# Patient Record
Sex: Male | Born: 1935
Health system: Southern US, Community
[De-identification: ages and names within clinical notes are randomized; demographics above are authoritative.]

## PROBLEM LIST (undated history)

## (undated) DIAGNOSIS — Z974 Presence of external hearing-aid: Secondary | ICD-10-CM

## (undated) DIAGNOSIS — Z87442 Personal history of urinary calculi: Secondary | ICD-10-CM

## (undated) DIAGNOSIS — I10 Essential (primary) hypertension: Secondary | ICD-10-CM

## (undated) DIAGNOSIS — Z8719 Personal history of other diseases of the digestive system: Secondary | ICD-10-CM

## (undated) DIAGNOSIS — D494 Neoplasm of unspecified behavior of bladder: Secondary | ICD-10-CM

## (undated) DIAGNOSIS — C801 Malignant (primary) neoplasm, unspecified: Secondary | ICD-10-CM

## (undated) DIAGNOSIS — Z87898 Personal history of other specified conditions: Secondary | ICD-10-CM

## (undated) DIAGNOSIS — E785 Hyperlipidemia, unspecified: Secondary | ICD-10-CM

## (undated) HISTORY — PX: COLONOSCOPY: SHX174

## (undated) HISTORY — PX: HIATAL HERNIA REPAIR: SHX195

---

## 2004-03-10 ENCOUNTER — Ambulatory Visit (HOSPITAL_COMMUNITY): Admission: RE | Admit: 2004-03-10 | Discharge: 2004-03-10 | Payer: Self-pay | Admitting: *Deleted

## 2005-06-22 ENCOUNTER — Ambulatory Visit (HOSPITAL_COMMUNITY): Admission: RE | Admit: 2005-06-22 | Discharge: 2005-06-22 | Payer: Self-pay | Admitting: *Deleted

## 2006-05-07 ENCOUNTER — Ambulatory Visit (HOSPITAL_BASED_OUTPATIENT_CLINIC_OR_DEPARTMENT_OTHER): Admission: RE | Admit: 2006-05-07 | Discharge: 2006-05-07 | Payer: Self-pay | Admitting: Orthopedic Surgery

## 2013-05-01 DIAGNOSIS — Z87898 Personal history of other specified conditions: Secondary | ICD-10-CM

## 2013-05-01 HISTORY — DX: Personal history of other specified conditions: Z87.898

## 2013-11-09 ENCOUNTER — Encounter (HOSPITAL_COMMUNITY): Payer: Self-pay | Admitting: Emergency Medicine

## 2013-11-09 ENCOUNTER — Emergency Department (HOSPITAL_COMMUNITY)
Admission: EM | Admit: 2013-11-09 | Discharge: 2013-11-09 | Disposition: A | Discharge status: Home / Self Care | Payer: Medicare Other | Attending: Emergency Medicine | Admitting: Emergency Medicine

## 2013-11-09 DIAGNOSIS — Z79899 Other long term (current) drug therapy: Secondary | ICD-10-CM | POA: Insufficient documentation

## 2013-11-09 DIAGNOSIS — I10 Essential (primary) hypertension: Secondary | ICD-10-CM | POA: Insufficient documentation

## 2013-11-09 DIAGNOSIS — R339 Retention of urine, unspecified: Secondary | ICD-10-CM | POA: Insufficient documentation

## 2013-11-09 DIAGNOSIS — E785 Hyperlipidemia, unspecified: Secondary | ICD-10-CM | POA: Insufficient documentation

## 2013-11-09 LAB — CBC WITH DIFFERENTIAL/PLATELET
Basophils Absolute: 0 10*3/uL (ref 0.0–0.1)
Basophils Relative: 0 % (ref 0–1)
EOS PCT: 0 % (ref 0–5)
Eosinophils Absolute: 0 10*3/uL (ref 0.0–0.7)
HEMATOCRIT: 43.9 % (ref 39.0–52.0)
Hemoglobin: 15.2 g/dL (ref 13.0–17.0)
LYMPHS ABS: 1.9 10*3/uL (ref 0.7–4.0)
LYMPHS PCT: 15 % (ref 12–46)
MCH: 30 pg (ref 26.0–34.0)
MCHC: 34.6 g/dL (ref 30.0–36.0)
MCV: 86.6 fL (ref 78.0–100.0)
Monocytes Absolute: 0.7 10*3/uL (ref 0.1–1.0)
Monocytes Relative: 6 % (ref 3–12)
NEUTROS ABS: 10.4 10*3/uL — AB (ref 1.7–7.7)
Neutrophils Relative %: 79 % — ABNORMAL HIGH (ref 43–77)
PLATELETS: 247 10*3/uL (ref 150–400)
RBC: 5.07 MIL/uL (ref 4.22–5.81)
RDW: 12.8 % (ref 11.5–15.5)
WBC: 13.1 10*3/uL — AB (ref 4.0–10.5)

## 2013-11-09 LAB — COMPREHENSIVE METABOLIC PANEL
ALK PHOS: 70 U/L (ref 39–117)
ALT: 16 U/L (ref 0–53)
AST: 18 U/L (ref 0–37)
Albumin: 4 g/dL (ref 3.5–5.2)
Anion gap: 16 — ABNORMAL HIGH (ref 5–15)
BUN: 20 mg/dL (ref 6–23)
CO2: 23 meq/L (ref 19–32)
Calcium: 9.2 mg/dL (ref 8.4–10.5)
Chloride: 101 mEq/L (ref 96–112)
Creatinine, Ser: 0.99 mg/dL (ref 0.50–1.35)
GFR, EST NON AFRICAN AMERICAN: 78 mL/min — AB (ref >90)
GLUCOSE: 116 mg/dL — AB (ref 70–99)
Potassium: 4.5 mEq/L (ref 3.7–5.3)
Sodium: 140 mEq/L (ref 137–147)
Total Bilirubin: 0.7 mg/dL (ref 0.3–1.2)
Total Protein: 7.1 g/dL (ref 6.0–8.3)

## 2013-11-09 LAB — URINALYSIS, ROUTINE W REFLEX MICROSCOPIC
Bilirubin Urine: NEGATIVE
Glucose, UA: NEGATIVE mg/dL
HGB URINE DIPSTICK: NEGATIVE
KETONES UR: 40 mg/dL — AB
Leukocytes, UA: NEGATIVE
Nitrite: NEGATIVE
PROTEIN: 100 mg/dL — AB
Specific Gravity, Urine: 1.028 (ref 1.005–1.030)
UROBILINOGEN UA: 1 mg/dL (ref 0.0–1.0)
pH: 5 (ref 5.0–8.0)

## 2013-11-09 LAB — URINE MICROSCOPIC-ADD ON

## 2013-11-09 NOTE — ED Provider Notes (Signed)
CSN: [id number]     Arrival date & time 11/09/13  1717 History   First MD Initiated Contact with Patient 11/09/13 1801     Chief Complaint  Patient presents with  . Urinary Retention     (Consider location/radiation/quality/duration/timing/severity/associated sxs/prior Treatment) HPI 78 year old male with a history of hypertension and hyperlipidemia who presents today with decreased urine output.  Per the patient he was able to pass urine this morning at approximately 900. Since that time he has had the urge to urinate but is been unable to do so. Patient also reports she has had mild loose stools over the course of the day and feels that he may be constipated. Patient states that he had a regular bowel movement possibly yesterday or the day before. Patient denies any fevers, nausea, vomiting. Patient denies any chest pain. Patient states he does have some mild suprapubic abdominal pain is cramping in nature.  History reviewed. No pertinent past medical history. History reviewed. No pertinent past surgical history. No family history on file. History  Substance Use Topics  . Smoking status: Never Smoker   . Smokeless tobacco: Not on file  . Alcohol Use: No    Review of Systems  Constitutional: Negative for fever and chills.  HENT: Negative for sore throat.   Eyes: Negative for pain.  Respiratory: Negative for cough and shortness of breath.   Cardiovascular: Negative for chest pain.  Gastrointestinal: Positive for abdominal pain. Negative for nausea and vomiting.  Genitourinary: Negative for dysuria and flank pain.  Musculoskeletal: Negative for back pain and neck pain.  Skin: Negative for rash.  Neurological: Negative for seizures and headaches.      Allergies  Review of patient's allergies indicates no known allergies.  Home Medications   Prior to Admission medications   Medication Sig Start Date End Date Taking? Authorizing Provider  lisinopril (PRINIVIL,ZESTRIL) 20 MG  tablet Take 20 mg by mouth daily. 08/31/13  Yes Historical Provider, MD  lovastatin (MEVACOR) 40 MG tablet Take 40 mg by mouth 2 (two) times daily. 10/20/13  Yes Historical Provider, MD   BP 134/86  Pulse 76  Temp(Src) 97.8 F (36.6 C) (Oral)  Resp 29  Ht 5\' 6"  (1.676 m)  Wt 185 lb (83.915 kg)  BMI 29.87 kg/m2  SpO2 95% Physical Exam  Constitutional: He is oriented to person, place, and time. He appears well-developed and well-nourished. No distress.  HENT:  Head: Normocephalic and atraumatic.  Eyes: Pupils are equal, round, and reactive to light.  Neck: Normal range of motion.  Cardiovascular: Normal rate and regular rhythm.   Pulmonary/Chest: Effort normal and breath sounds normal.  Abdominal: Soft. He exhibits no distension. There is tenderness in the suprapubic area. There is no rigidity, no guarding and no tenderness at McBurney's point.  Musculoskeletal: Normal range of motion.  Neurological: He is alert and oriented to person, place, and time.  Skin: Skin is warm. He is not diaphoretic.    ED Course  Procedures (including critical care time) Labs Review Labs Reviewed  CBC WITH DIFFERENTIAL - Abnormal; Notable for the following:    WBC 13.1 (*)    Neutrophils Relative % 79 (*)    Neutro Abs 10.4 (*)    All other components within normal limits  COMPREHENSIVE METABOLIC PANEL - Abnormal; Notable for the following:    Glucose, Bld 116 (*)    GFR calc non Af Amer 78 (*)    Anion gap 16 (*)    All other components within  normal limits  URINALYSIS, ROUTINE W REFLEX MICROSCOPIC - Abnormal; Notable for the following:    Color, Urine AMBER (*)    Ketones, ur 40 (*)    Protein, ur 100 (*)    All other components within normal limits  URINE MICROSCOPIC-ADD ON - Abnormal; Notable for the following:    Casts GRANULAR CAST (*)    All other components within normal limits    Imaging Review No results found.   EKG Interpretation None      MDM   Final diagnoses:  Urine  retention   78 year old male with a history of hypertension and hyperlipidemia presents today for acute urinary retention.  History as documented above. On arrival in the patient appears in no acute distress. He is hemodynamically stable. Patient had a bedside bladder scan done that demonstrates a likely triggered 50 cc of urine in his bladder. Given the patient is been unable to void here, the plan is to place a Foley catheter. Foley catheter was placed and had greater than 300 cc of urine output. Disposition is pending the results of his urinalysis. We'll consider leaving catheter in place and followup with urologist.  Urinalysis done which is no signs of overt infection. We'll leave the Foley in place and will have the patient follow up with urology and or his primary care physician. Strict return progression for given. Patient discharged in stable condition. Patient was seen and evaluated by myself and by the attending Dr. Darl Householder.     Freddi Che, MD 11/10/13 [phone]

## 2013-11-09 NOTE — ED Notes (Signed)
Brought pt back to room via wheelchair; pt undressed totally, placed in gown, on monitor, continuous pulse oximetry and blood pressure cuff; 3 green chuks placed underneath pt for he has been having loose bowel movements; pt aware of need of urine specimen and stated " I feel like I need to go but I just can't" urinal placed at bedside for pt's convenience; Ronny Bacon, NT present

## 2013-11-09 NOTE — ED Notes (Signed)
Pt reports no urinary output since 0900 today. Pt denies history of same. Pt reports loose stools and feels he may be constipated. Pt unsure of when he had last normal BM. Pt with distended abdomen reports unchanged.

## 2013-11-09 NOTE — Discharge Instructions (Signed)
Acute Urinary Retention °Acute urinary retention is the temporary inability to urinate. °This is a common problem in older men. As men age their prostates become larger and block the flow of urine from the bladder. This is usually a problem that has come on gradually.  °HOME CARE INSTRUCTIONS °If you are sent home with a Foley catheter and a drainage system, you will need to discuss the best course of action with your health care provider. While the catheter is in, maintain a good intake of fluids. Keep the drainage bag emptied and lower than your catheter. This is so that contaminated urine will not flow back into your bladder, which could lead to a urinary tract infection. °There are two main types of drainage bags. One is a large bag that usually is used at night. It has a good capacity that will allow you to sleep through the night without having to empty it. The second type is called a leg bag. It has a smaller capacity, so it needs to be emptied more frequently. However, the main advantage is that it can be attached by a leg strap and can go underneath your clothing, allowing you the freedom to move about or leave your home. °Only take over-the-counter or prescription medicines for pain, discomfort, or fever as directed by your health care provider.  °SEEK MEDICAL CARE IF: °· You develop a low-grade fever. °· You experience spasms or leakage of urine with the spasms. °SEEK IMMEDIATE MEDICAL CARE IF:  °· You develop chills or fever. °· Your catheter stops draining urine. °· Your catheter falls out. °· You start to develop increased bleeding that does not respond to rest and increased fluid intake. °MAKE SURE YOU: °· Understand these instructions. °· Will watch your condition. °· Will get help right away if you are not doing well or get worse. °Document Released: 07/24/2000 Document Revised: 04/22/2013 Document Reviewed: 09/26/2012 °Exit[employer]® Patient Information ©2015 [employer]. This information is not  intended to replace advice given to you by your health care provider. Make sure you discuss any questions you have with your health care provider. ° °

## 2013-11-11 NOTE — ED Provider Notes (Signed)
I saw and evaluated the patient, reviewed the resident's note and I agree with the findings and plan.   EKG Interpretation None      Zachary Ingram is a 78 y.o. male here with urinary retention. Unable to urinate since 9 am this morning. Has lower abdominal pain. Patient unable to urinate in the ED. Foley has more than 300 cc urine output. Abdomen soft after foley placed. Labs and UA unremarkable. Will keep foley in and have him f/u with urology.    Wandra Arthurs, MD 11/11/13 2227

## 2014-09-23 DIAGNOSIS — R339 Retention of urine, unspecified: Secondary | ICD-10-CM | POA: Diagnosis not present

## 2014-09-23 DIAGNOSIS — N401 Enlarged prostate with lower urinary tract symptoms: Secondary | ICD-10-CM | POA: Diagnosis not present

## 2014-09-30 DIAGNOSIS — K4090 Unilateral inguinal hernia, without obstruction or gangrene, not specified as recurrent: Secondary | ICD-10-CM | POA: Diagnosis not present

## 2014-09-30 DIAGNOSIS — N401 Enlarged prostate with lower urinary tract symptoms: Secondary | ICD-10-CM | POA: Diagnosis not present

## 2014-09-30 DIAGNOSIS — R351 Nocturia: Secondary | ICD-10-CM | POA: Diagnosis not present

## 2014-09-30 DIAGNOSIS — N138 Other obstructive and reflux uropathy: Secondary | ICD-10-CM | POA: Diagnosis not present

## 2014-09-30 DIAGNOSIS — R300 Dysuria: Secondary | ICD-10-CM | POA: Diagnosis not present

## 2014-10-20 DIAGNOSIS — Z1389 Encounter for screening for other disorder: Secondary | ICD-10-CM | POA: Diagnosis not present

## 2014-10-20 DIAGNOSIS — Z0000 Encounter for general adult medical examination without abnormal findings: Secondary | ICD-10-CM | POA: Diagnosis not present

## 2014-10-20 DIAGNOSIS — E782 Mixed hyperlipidemia: Secondary | ICD-10-CM | POA: Diagnosis not present

## 2014-10-20 DIAGNOSIS — R7301 Impaired fasting glucose: Secondary | ICD-10-CM | POA: Diagnosis not present

## 2014-10-27 DIAGNOSIS — I10 Essential (primary) hypertension: Secondary | ICD-10-CM | POA: Diagnosis not present

## 2014-10-27 DIAGNOSIS — Z0000 Encounter for general adult medical examination without abnormal findings: Secondary | ICD-10-CM | POA: Diagnosis not present

## 2014-10-27 DIAGNOSIS — E782 Mixed hyperlipidemia: Secondary | ICD-10-CM | POA: Diagnosis not present

## 2014-10-27 DIAGNOSIS — R7301 Impaired fasting glucose: Secondary | ICD-10-CM | POA: Diagnosis not present

## 2014-10-27 DIAGNOSIS — Z23 Encounter for immunization: Secondary | ICD-10-CM | POA: Diagnosis not present

## 2015-05-11 DIAGNOSIS — R7301 Impaired fasting glucose: Secondary | ICD-10-CM | POA: Diagnosis not present

## 2015-05-11 DIAGNOSIS — E782 Mixed hyperlipidemia: Secondary | ICD-10-CM | POA: Diagnosis not present

## 2015-05-11 DIAGNOSIS — I10 Essential (primary) hypertension: Secondary | ICD-10-CM | POA: Diagnosis not present

## 2015-05-18 DIAGNOSIS — E782 Mixed hyperlipidemia: Secondary | ICD-10-CM | POA: Diagnosis not present

## 2015-05-18 DIAGNOSIS — I10 Essential (primary) hypertension: Secondary | ICD-10-CM | POA: Diagnosis not present

## 2015-05-18 DIAGNOSIS — R7301 Impaired fasting glucose: Secondary | ICD-10-CM | POA: Diagnosis not present

## 2015-11-23 DIAGNOSIS — I10 Essential (primary) hypertension: Secondary | ICD-10-CM | POA: Diagnosis not present

## 2015-11-23 DIAGNOSIS — R7301 Impaired fasting glucose: Secondary | ICD-10-CM | POA: Diagnosis not present

## 2015-11-23 DIAGNOSIS — E782 Mixed hyperlipidemia: Secondary | ICD-10-CM | POA: Diagnosis not present

## 2015-12-01 DIAGNOSIS — I10 Essential (primary) hypertension: Secondary | ICD-10-CM | POA: Diagnosis not present

## 2015-12-01 DIAGNOSIS — M150 Primary generalized (osteo)arthritis: Secondary | ICD-10-CM | POA: Diagnosis not present

## 2015-12-01 DIAGNOSIS — E782 Mixed hyperlipidemia: Secondary | ICD-10-CM | POA: Diagnosis not present

## 2015-12-01 DIAGNOSIS — R7301 Impaired fasting glucose: Secondary | ICD-10-CM | POA: Diagnosis not present

## 2015-12-08 DIAGNOSIS — I10 Essential (primary) hypertension: Secondary | ICD-10-CM | POA: Diagnosis not present

## 2015-12-08 DIAGNOSIS — E782 Mixed hyperlipidemia: Secondary | ICD-10-CM | POA: Diagnosis not present

## 2015-12-08 DIAGNOSIS — Z1211 Encounter for screening for malignant neoplasm of colon: Secondary | ICD-10-CM | POA: Diagnosis not present

## 2016-01-06 DIAGNOSIS — K5730 Diverticulosis of large intestine without perforation or abscess without bleeding: Secondary | ICD-10-CM | POA: Diagnosis not present

## 2016-01-06 DIAGNOSIS — Z1211 Encounter for screening for malignant neoplasm of colon: Secondary | ICD-10-CM | POA: Diagnosis not present

## 2016-06-06 DIAGNOSIS — H2512 Age-related nuclear cataract, left eye: Secondary | ICD-10-CM | POA: Diagnosis not present

## 2016-06-06 DIAGNOSIS — H35372 Puckering of macula, left eye: Secondary | ICD-10-CM | POA: Diagnosis not present

## 2016-06-06 DIAGNOSIS — H2511 Age-related nuclear cataract, right eye: Secondary | ICD-10-CM | POA: Diagnosis not present

## 2016-06-06 DIAGNOSIS — H04123 Dry eye syndrome of bilateral lacrimal glands: Secondary | ICD-10-CM | POA: Diagnosis not present

## 2016-06-14 DIAGNOSIS — R7301 Impaired fasting glucose: Secondary | ICD-10-CM | POA: Diagnosis not present

## 2016-06-14 DIAGNOSIS — E782 Mixed hyperlipidemia: Secondary | ICD-10-CM | POA: Diagnosis not present

## 2016-06-21 DIAGNOSIS — I10 Essential (primary) hypertension: Secondary | ICD-10-CM | POA: Diagnosis not present

## 2016-06-21 DIAGNOSIS — M150 Primary generalized (osteo)arthritis: Secondary | ICD-10-CM | POA: Diagnosis not present

## 2016-06-21 DIAGNOSIS — R7301 Impaired fasting glucose: Secondary | ICD-10-CM | POA: Diagnosis not present

## 2016-06-21 DIAGNOSIS — E782 Mixed hyperlipidemia: Secondary | ICD-10-CM | POA: Diagnosis not present

## 2016-07-04 DIAGNOSIS — H6123 Impacted cerumen, bilateral: Secondary | ICD-10-CM | POA: Diagnosis not present

## 2016-07-13 DIAGNOSIS — H2512 Age-related nuclear cataract, left eye: Secondary | ICD-10-CM | POA: Diagnosis not present

## 2016-08-03 DIAGNOSIS — H2511 Age-related nuclear cataract, right eye: Secondary | ICD-10-CM | POA: Diagnosis not present

## 2016-09-01 DIAGNOSIS — H35372 Puckering of macula, left eye: Secondary | ICD-10-CM | POA: Diagnosis not present

## 2016-11-29 HISTORY — PX: CATARACT EXTRACTION W/ INTRAOCULAR LENS  IMPLANT, BILATERAL: SHX1307

## 2016-12-04 DIAGNOSIS — H35372 Puckering of macula, left eye: Secondary | ICD-10-CM | POA: Diagnosis not present

## 2016-12-27 DIAGNOSIS — H60502 Unspecified acute noninfective otitis externa, left ear: Secondary | ICD-10-CM | POA: Diagnosis not present

## 2016-12-27 DIAGNOSIS — Z23 Encounter for immunization: Secondary | ICD-10-CM | POA: Diagnosis not present

## 2016-12-27 DIAGNOSIS — H6123 Impacted cerumen, bilateral: Secondary | ICD-10-CM | POA: Diagnosis not present

## 2017-01-10 DIAGNOSIS — Z0000 Encounter for general adult medical examination without abnormal findings: Secondary | ICD-10-CM | POA: Diagnosis not present

## 2017-01-10 DIAGNOSIS — R7301 Impaired fasting glucose: Secondary | ICD-10-CM | POA: Diagnosis not present

## 2017-01-10 DIAGNOSIS — E782 Mixed hyperlipidemia: Secondary | ICD-10-CM | POA: Diagnosis not present

## 2017-01-17 DIAGNOSIS — I10 Essential (primary) hypertension: Secondary | ICD-10-CM | POA: Diagnosis not present

## 2017-01-17 DIAGNOSIS — R319 Hematuria, unspecified: Secondary | ICD-10-CM | POA: Diagnosis not present

## 2017-01-17 DIAGNOSIS — E782 Mixed hyperlipidemia: Secondary | ICD-10-CM | POA: Diagnosis not present

## 2017-01-17 DIAGNOSIS — R7301 Impaired fasting glucose: Secondary | ICD-10-CM | POA: Diagnosis not present

## 2017-01-23 DIAGNOSIS — H10022 Other mucopurulent conjunctivitis, left eye: Secondary | ICD-10-CM | POA: Diagnosis not present

## 2017-07-02 DIAGNOSIS — H6123 Impacted cerumen, bilateral: Secondary | ICD-10-CM | POA: Diagnosis not present

## 2017-07-18 DIAGNOSIS — E782 Mixed hyperlipidemia: Secondary | ICD-10-CM | POA: Diagnosis not present

## 2017-07-18 DIAGNOSIS — R7301 Impaired fasting glucose: Secondary | ICD-10-CM | POA: Diagnosis not present

## 2017-07-25 DIAGNOSIS — R319 Hematuria, unspecified: Secondary | ICD-10-CM | POA: Diagnosis not present

## 2017-07-25 DIAGNOSIS — I10 Essential (primary) hypertension: Secondary | ICD-10-CM | POA: Diagnosis not present

## 2017-07-25 DIAGNOSIS — R7301 Impaired fasting glucose: Secondary | ICD-10-CM | POA: Diagnosis not present

## 2017-07-25 DIAGNOSIS — E782 Mixed hyperlipidemia: Secondary | ICD-10-CM | POA: Diagnosis not present

## 2017-07-30 DIAGNOSIS — R3121 Asymptomatic microscopic hematuria: Secondary | ICD-10-CM | POA: Diagnosis not present

## 2017-08-07 DIAGNOSIS — R3121 Asymptomatic microscopic hematuria: Secondary | ICD-10-CM | POA: Diagnosis not present

## 2017-08-07 DIAGNOSIS — R3129 Other microscopic hematuria: Secondary | ICD-10-CM | POA: Diagnosis not present

## 2017-08-09 DIAGNOSIS — R911 Solitary pulmonary nodule: Secondary | ICD-10-CM | POA: Diagnosis not present

## 2017-08-09 DIAGNOSIS — N200 Calculus of kidney: Secondary | ICD-10-CM | POA: Diagnosis not present

## 2017-08-09 DIAGNOSIS — C670 Malignant neoplasm of trigone of bladder: Secondary | ICD-10-CM | POA: Diagnosis not present

## 2017-08-14 ENCOUNTER — Encounter (HOSPITAL_BASED_OUTPATIENT_CLINIC_OR_DEPARTMENT_OTHER): Payer: Self-pay | Admitting: *Deleted

## 2017-08-14 ENCOUNTER — Other Ambulatory Visit: Payer: Self-pay | Admitting: Urology

## 2017-08-21 ENCOUNTER — Encounter (HOSPITAL_BASED_OUTPATIENT_CLINIC_OR_DEPARTMENT_OTHER): Payer: Self-pay | Admitting: *Deleted

## 2017-08-21 ENCOUNTER — Other Ambulatory Visit: Payer: Self-pay

## 2017-08-21 NOTE — Progress Notes (Signed)
SPOKE W/ PT VIA PHONE FOR PRE-OP INTERVIEW.  NPO AFTER MN.  ARRIVE AT [phone].  NEEDS ISTAT AND EKG.

## 2017-08-22 NOTE — H&P (Signed)
CC: I have bladder cancer.  HPI: Zachary Ingram is a 82 year-old male established patient who is here for bladder cancer.    Zachary Ingram came in today after I left a message to call me back about the CT scan done on 08/07/17. He was found to have an enhancing 2mm lesion near the right UO consistent with a bladder tumor. He was also found to have non-obstructing renal stones, a right inguinal hernia with some bowel and bladder involvement and small pulmonary nodules. He is currently doing well without complaints.      ALLERGIES: No Allergies    MEDICATIONS: Aspirin Ec 81 mg tablet, delayed release Oral  Fish Oil CAPS Oral  Lisinopril 10 mg tablet Oral  Lovastatin 40 mg tablet     GU PSH: Locm 300-399Mg /Ml Iodine,1Ml - 08/07/2017 Vasectomy - 1982      PSH Notes: Jaw Surgery   NON-GU PSH: None   GU PMH: Microscopic hematuria, He has significant microhematuria and needs eval with CT and possible cystoscopy. - 07/30/2017 BPH w/LUTS, Benign prostatic hyperplasia with urinary obstruction - August 10, 2014 Dysuria, Dysuria - 08/10/2014 Nocturia, Nocturia - 08/10/2014 Unil Inguinal Hernia W/O obst or gang,non-recurrent, Inguinal hernia, right - 08-10-14 Urinary Retention, Unspec, Urinary retention - Aug 09, 2013 Gross hematuria, Gross hematuria - August 09, 2013    NON-GU PMH: Encounter for general adult medical examination without abnormal findings, Encounter for preventive health examination - 2014/08/10 Personal history of other diseases of the circulatory system, History of hypertension - 08-09-13 Personal history of other endocrine, nutritional and metabolic disease, History of hypercholesterolemia - 2013/08/09    FAMILY HISTORY: Death of family member - Runs In Family   SOCIAL HISTORY: Marital Status: Married Preferred Language: English; Race: White Current Smoking Status: Patient has never smoked.   Tobacco Use Assessment Completed: Used Tobacco in last 30 days? Drinks 3 caffeinated drinks per day.     Notes: Retired, Divorced, No alcohol  use, Never a smoker, Daily caffeine consumption, 2-3 servings a day, One child   REVIEW OF SYSTEMS:    GU Review Male:   Patient denies frequent urination, hard to postpone urination, burning/ pain with urination, get up at night to urinate, leakage of urine, stream starts and stops, trouble starting your stream, have to strain to urinate , erection problems, and penile pain.  Gastrointestinal (Upper):   Patient denies nausea, vomiting, and indigestion/ heartburn.  Gastrointestinal (Lower):   Patient denies diarrhea and constipation.  Constitutional:   Patient denies fever, night sweats, weight loss, and fatigue.  Skin:   Patient denies skin rash/ lesion and itching.  Eyes:   Patient denies blurred vision and double vision.  Ears/ Nose/ Throat:   Patient denies sore throat and sinus problems.  Hematologic/Lymphatic:   Patient denies swollen glands and easy bruising.  Cardiovascular:   Patient denies leg swelling and chest pains.  Respiratory:   Patient denies cough and shortness of breath.  Endocrine:   Patient denies excessive thirst.  Musculoskeletal:   Patient denies joint pain and back pain.  Neurological:   Patient denies headaches and dizziness.  Psychologic:   Patient denies depression and anxiety.   VITAL SIGNS: None   MULTI-SYSTEM PHYSICAL EXAMINATION:    Constitutional: Well-nourished. No physical deformities. Normally developed. Good grooming.  Respiratory: No labored breathing, no use of accessory muscles. Normal breath sounds.   Cardiovascular: Regular rate and rhythm. No murmur, no gallop. no swelling, no varicosities.      PAST DATA REVIEWED:  Source Of History:  Patient  X-Ray Review: C.T. Hematuria: Reviewed Films. Reviewed Report. Discussed With Patient.     09/24/14  PSA  Total PSA 2.16     PROCEDURES: None   ASSESSMENT:      ICD-10 Details  1 GU:   Bladder Cancer Trigone - C67.0 He has an 86mm right trigonal lesion that is worrisome for UCCa. I am going to  get him set up for cystoscopy with TURBT, possible right stent and possible epirubicin instillation. I reviewed the risks of bleeding, infection, bladder injury, need for a stent and secondary procedures, chemical cystitis, thrombotic events and anesthetic complications.   2   Renal calculus - N20.0   3 NON-GU:   Solitary pulmonary nodule - R91.1 He will need a Chest CT for further evaluation. I will discuss that with him at his post op visit.    PLAN:           Schedule Return Visit/Planned Activity: Next Available Appointment - Schedule Surgery          Document Letter(s):  Created for Patient: Clinical Summary         Notes:   CC: Dr. Merrilee Seashore.         Next Appointment:      Next Appointment: 08/29/2017 10:15 AM    Appointment Type: Male Cysto    Location: [facility] [phone]    Provider: [name], M.D.    Reason for Visit: next avail cysto

## 2017-08-23 ENCOUNTER — Ambulatory Visit (HOSPITAL_BASED_OUTPATIENT_CLINIC_OR_DEPARTMENT_OTHER)
Admission: RE | Admit: 2017-08-23 | Discharge: 2017-08-23 | Disposition: A | Discharge status: Home / Self Care | Payer: Medicare Other | Source: Ambulatory Visit | Attending: Urology | Admitting: Urology

## 2017-08-23 ENCOUNTER — Ambulatory Visit (HOSPITAL_BASED_OUTPATIENT_CLINIC_OR_DEPARTMENT_OTHER): Payer: Medicare Other | Admitting: Anesthesiology

## 2017-08-23 ENCOUNTER — Encounter (HOSPITAL_BASED_OUTPATIENT_CLINIC_OR_DEPARTMENT_OTHER)
Admission: RE | Disposition: A | Discharge status: Home / Self Care | Payer: Self-pay | Source: Ambulatory Visit | Attending: Urology

## 2017-08-23 ENCOUNTER — Encounter (HOSPITAL_BASED_OUTPATIENT_CLINIC_OR_DEPARTMENT_OTHER): Payer: Self-pay | Admitting: *Deleted

## 2017-08-23 DIAGNOSIS — N3289 Other specified disorders of bladder: Secondary | ICD-10-CM | POA: Diagnosis not present

## 2017-08-23 DIAGNOSIS — D494 Neoplasm of unspecified behavior of bladder: Secondary | ICD-10-CM | POA: Diagnosis not present

## 2017-08-23 DIAGNOSIS — Z79899 Other long term (current) drug therapy: Secondary | ICD-10-CM | POA: Diagnosis not present

## 2017-08-23 DIAGNOSIS — E7800 Pure hypercholesterolemia, unspecified: Secondary | ICD-10-CM | POA: Diagnosis not present

## 2017-08-23 DIAGNOSIS — Z7982 Long term (current) use of aspirin: Secondary | ICD-10-CM | POA: Diagnosis not present

## 2017-08-23 DIAGNOSIS — C670 Malignant neoplasm of trigone of bladder: Secondary | ICD-10-CM | POA: Insufficient documentation

## 2017-08-23 DIAGNOSIS — R911 Solitary pulmonary nodule: Secondary | ICD-10-CM | POA: Insufficient documentation

## 2017-08-23 DIAGNOSIS — C679 Malignant neoplasm of bladder, unspecified: Secondary | ICD-10-CM | POA: Diagnosis not present

## 2017-08-23 DIAGNOSIS — N400 Enlarged prostate without lower urinary tract symptoms: Secondary | ICD-10-CM | POA: Diagnosis not present

## 2017-08-23 DIAGNOSIS — I10 Essential (primary) hypertension: Secondary | ICD-10-CM | POA: Diagnosis not present

## 2017-08-23 DIAGNOSIS — E785 Hyperlipidemia, unspecified: Secondary | ICD-10-CM | POA: Diagnosis not present

## 2017-08-23 DIAGNOSIS — R310 Gross hematuria: Secondary | ICD-10-CM | POA: Diagnosis not present

## 2017-08-23 HISTORY — DX: Personal history of other specified conditions: Z87.898

## 2017-08-23 HISTORY — PX: TRANSURETHRAL RESECTION OF BLADDER TUMOR WITH MITOMYCIN-C: SHX6459

## 2017-08-23 HISTORY — DX: Neoplasm of unspecified behavior of bladder: D49.4

## 2017-08-23 HISTORY — DX: Hyperlipidemia, unspecified: E78.5

## 2017-08-23 HISTORY — PX: CYSTOSCOPY W/ URETERAL STENT PLACEMENT: SHX1429

## 2017-08-23 HISTORY — DX: Presence of external hearing-aid: Z97.4

## 2017-08-23 HISTORY — DX: Essential (primary) hypertension: I10

## 2017-08-23 HISTORY — DX: Personal history of other diseases of the digestive system: Z87.19

## 2017-08-23 LAB — POCT I-STAT 4, (NA,K, GLUC, HGB,HCT)
Glucose, Bld: 124 mg/dL — ABNORMAL HIGH (ref 65–99)
HEMATOCRIT: 41 % (ref 39.0–52.0)
HEMOGLOBIN: 13.9 g/dL (ref 13.0–17.0)
Potassium: 4.3 mmol/L (ref 3.5–5.1)
Sodium: 140 mmol/L (ref 135–145)

## 2017-08-23 SURGERY — TRANSURETHRAL RESECTION OF BLADDER TUMOR WITH MITOMYCIN-C
Anesthesia: General | Laterality: Right

## 2017-08-23 MED ORDER — DEXAMETHASONE SODIUM PHOSPHATE 10 MG/ML IJ SOLN
INTRAMUSCULAR | Status: AC
  Filled 2017-08-23: qty 1

## 2017-08-23 MED ORDER — FENTANYL CITRATE (PF) 100 MCG/2ML IJ SOLN
25.0000 ug | INTRAMUSCULAR | Status: DC | PRN
  Filled 2017-08-23: qty 1

## 2017-08-23 MED ORDER — LIDOCAINE 2% (20 MG/ML) 5 ML SYRINGE
INTRAMUSCULAR | Status: DC | PRN
  Administered 2017-08-23: 80 mg via INTRAVENOUS

## 2017-08-23 MED ORDER — LACTATED RINGERS IV SOLN
INTRAVENOUS | Status: DC
  Administered 2017-08-23: 10:00:00 via INTRAVENOUS
  Filled 2017-08-23: qty 1000

## 2017-08-23 MED ORDER — SODIUM CHLORIDE 0.9 % IR SOLN
Status: DC | PRN
  Administered 2017-08-23 (×2): 3000 mL via INTRAVESICAL

## 2017-08-23 MED ORDER — ONDANSETRON HCL 4 MG/2ML IJ SOLN
INTRAMUSCULAR | Status: AC
  Filled 2017-08-23: qty 2

## 2017-08-23 MED ORDER — IOHEXOL 300 MG/ML  SOLN
INTRAMUSCULAR | Status: DC | PRN
  Administered 2017-08-23: 10 mL via URETHRAL

## 2017-08-23 MED ORDER — PROPOFOL 10 MG/ML IV BOLUS
INTRAVENOUS | Status: AC
  Filled 2017-08-23: qty 40

## 2017-08-23 MED ORDER — STERILE WATER FOR IRRIGATION IR SOLN
Status: DC | PRN
  Administered 2017-08-23: 500 mL

## 2017-08-23 MED ORDER — PROPOFOL 10 MG/ML IV BOLUS
INTRAVENOUS | Status: DC | PRN
  Administered 2017-08-23: 150 mg via INTRAVENOUS

## 2017-08-23 MED ORDER — TRAMADOL HCL 50 MG PO TABS: 50.0000 mg | ORAL_TABLET | Freq: Four times a day (QID) | ORAL | 0 refills | Status: DC | PRN

## 2017-08-23 MED ORDER — ONDANSETRON HCL 4 MG/2ML IJ SOLN
INTRAMUSCULAR | Status: DC | PRN
  Administered 2017-08-23: 4 mg via INTRAVENOUS

## 2017-08-23 MED ORDER — FENTANYL CITRATE (PF) 100 MCG/2ML IJ SOLN
INTRAMUSCULAR | Status: AC
  Filled 2017-08-23: qty 4

## 2017-08-23 MED ORDER — CEFAZOLIN SODIUM-DEXTROSE 2-4 GM/100ML-% IV SOLN
2.0000 g | INTRAVENOUS | Status: AC
  Administered 2017-08-23: 2 g via INTRAVENOUS
  Filled 2017-08-23: qty 100

## 2017-08-23 MED ORDER — ACETAMINOPHEN 10 MG/ML IV SOLN
INTRAVENOUS | Status: AC
  Filled 2017-08-23: qty 100

## 2017-08-23 MED ORDER — CEFAZOLIN SODIUM-DEXTROSE 2-4 GM/100ML-% IV SOLN
INTRAVENOUS | Status: AC
  Filled 2017-08-23: qty 100

## 2017-08-23 MED ORDER — ACETAMINOPHEN 10 MG/ML IV SOLN
INTRAVENOUS | Status: DC | PRN
  Administered 2017-08-23: 1000 mg via INTRAVENOUS

## 2017-08-23 MED ORDER — LIDOCAINE 2% (20 MG/ML) 5 ML SYRINGE
INTRAMUSCULAR | Status: AC
  Filled 2017-08-23: qty 5

## 2017-08-23 MED ORDER — SODIUM CHLORIDE 0.9 % IJ SOLN
80.0000 mg | Freq: Once | INTRAVENOUS | Status: DC
  Filled 2017-08-23: qty 40

## 2017-08-23 MED ORDER — SUCCINYLCHOLINE CHLORIDE 200 MG/10ML IV SOSY
PREFILLED_SYRINGE | INTRAVENOUS | Status: AC
  Filled 2017-08-23: qty 10

## 2017-08-23 MED ORDER — KETOROLAC TROMETHAMINE 30 MG/ML IJ SOLN
INTRAMUSCULAR | Status: AC
  Filled 2017-08-23: qty 1

## 2017-08-23 MED ORDER — FENTANYL CITRATE (PF) 100 MCG/2ML IJ SOLN
INTRAMUSCULAR | Status: DC | PRN
  Administered 2017-08-23: 25 ug via INTRAVENOUS
  Administered 2017-08-23: 50 ug via INTRAVENOUS
  Administered 2017-08-23: 25 ug via INTRAVENOUS

## 2017-08-23 MED ORDER — MEPERIDINE HCL 25 MG/ML IJ SOLN
6.2500 mg | INTRAMUSCULAR | Status: DC | PRN
  Filled 2017-08-23: qty 1

## 2017-08-23 MED ORDER — DEXAMETHASONE SODIUM PHOSPHATE 10 MG/ML IJ SOLN
INTRAMUSCULAR | Status: DC | PRN
  Administered 2017-08-23: 10 mg via INTRAVENOUS

## 2017-08-23 SURGICAL SUPPLY — 37 items
BAG DRAIN URO-CYSTO SKYTR STRL (DRAIN) ×4 IMPLANT
BAG URINE DRAINAGE (UROLOGICAL SUPPLIES) IMPLANT
BAG URINE LEG 19OZ MD ST LTX (BAG) IMPLANT
BASKET STONE 1.7 NGAGE (UROLOGICAL SUPPLIES) IMPLANT
BASKET ZERO TIP NITINOL 2.4FR (BASKET) IMPLANT
CATH FOLEY 2WAY SLVR  5CC 18FR (CATHETERS) ×2
CATH FOLEY 2WAY SLVR  5CC 20FR (CATHETERS)
CATH FOLEY 2WAY SLVR  5CC 22FR (CATHETERS)
CATH FOLEY 2WAY SLVR 5CC 18FR (CATHETERS) ×2 IMPLANT
CATH FOLEY 2WAY SLVR 5CC 20FR (CATHETERS) IMPLANT
CATH FOLEY 2WAY SLVR 5CC 22FR (CATHETERS) IMPLANT
CATH URET 5FR 28IN CONE TIP (BALLOONS)
CATH URET 5FR 28IN OPEN ENDED (CATHETERS) IMPLANT
CATH URET 5FR 70CM CONE TIP (BALLOONS) IMPLANT
CLOTH BEACON ORANGE TIMEOUT ST (SAFETY) ×4 IMPLANT
ELECT REM PT RETURN 9FT ADLT (ELECTROSURGICAL) ×4
ELECTRODE REM PT RTRN 9FT ADLT (ELECTROSURGICAL) ×2 IMPLANT
FIBER LASER FLEXIVA 365 (UROLOGICAL SUPPLIES) IMPLANT
FIBER LASER TRAC TIP (UROLOGICAL SUPPLIES) IMPLANT
GLOVE SURG SS PI 8.0 STRL IVOR (GLOVE) ×4 IMPLANT
GOWN STRL REUS W/TWL XL LVL3 (GOWN DISPOSABLE) ×4 IMPLANT
GUIDEWIRE 0.038 PTFE COATED (WIRE) IMPLANT
GUIDEWIRE ANG ZIPWIRE 038X150 (WIRE) IMPLANT
GUIDEWIRE STR DUAL SENSOR (WIRE) ×4 IMPLANT
HOLDER FOLEY CATH W/STRAP (MISCELLANEOUS) ×4 IMPLANT
INFUSOR MANOMETER BAG 3000ML (MISCELLANEOUS) ×4 IMPLANT
IV NS IRRIG 3000ML ARTHROMATIC (IV SOLUTION) ×4 IMPLANT
KIT TURNOVER CYSTO (KITS) ×4 IMPLANT
LOOP CUT BIPOLAR 24F LRG (ELECTROSURGICAL) ×4 IMPLANT
MANIFOLD NEPTUNE II (INSTRUMENTS) ×4 IMPLANT
NS IRRIG 500ML POUR BTL (IV SOLUTION) ×4 IMPLANT
PACK CYSTO (CUSTOM PROCEDURE TRAY) ×4 IMPLANT
PLUG CATH AND CAP STER (CATHETERS) IMPLANT
STENT URET 6FRX24 CONTOUR (STENTS) ×4 IMPLANT
SYRINGE IRR TOOMEY STRL 70CC (SYRINGE) IMPLANT
TUBE CONNECTING 12'X1/4 (SUCTIONS) ×1
TUBE CONNECTING 12X1/4 (SUCTIONS) ×3 IMPLANT

## 2017-08-23 NOTE — Discharge Instructions (Addendum)
°Post Anesthesia Home Care Instructions ° °Activity: °Get plenty of rest for the remainder of the day. A responsible individual must stay with you for 24 hours following the procedure.  °For the next 24 hours, DO NOT: °-Drive a car °-Operate machinery °-Drink alcoholic beverages °-Take any medication unless instructed by your physician °-Make any legal decisions or sign important papers. ° °Meals: °Start with liquid foods such as gelatin or soup. Progress to regular foods as tolerated. Avoid greasy, spicy, heavy foods. If nausea and/or vomiting occur, drink only clear liquids until the nausea and/or vomiting subsides. Call your physician if vomiting continues. ° °Special Instructions/Symptoms: °Your throat may feel dry or sore from the anesthesia or the breathing tube placed in your throat during surgery. If this causes discomfort, gargle with warm salt water. The discomfort should disappear within 24 hours. ° °If you had a scopolamine patch placed behind your ear for the management of post- operative nausea and/or vomiting: ° °1. The medication in the patch is effective for 72 hours, after which it should be removed.  Wrap patch in a tissue and discard in the trash. Wash hands thoroughly with soap and water. °2. You may remove the patch earlier than 72 hours if you experience unpleasant side effects which may include dry mouth, dizziness or visual disturbances. °3. Avoid touching the patch. Wash your hands with soap and water after contact with the patch. °  °Indwelling Urinary Catheter Care, Adult °Take good care of your catheter to keep it working and to prevent problems. °How to wear your catheter °Attach your catheter to your leg with tape (adhesive tape) or a leg strap. Make sure it is not too tight. If you use tape, remove any bits of tape that are already on the catheter. °How to wear a drainage bag °You should have: °· A large overnight bag. °· A small leg bag. ° °Overnight Bag °You may wear the overnight  bag at any time. Always keep the bag below the level of your bladder but off the floor. When you sleep, put a clean plastic bag in a wastebasket. Then hang the bag inside the wastebasket. °Leg Bag °Never wear the leg bag at night. Always wear the leg bag below your knee. Keep the leg bag secure with a leg strap or tape. °How to care for your skin °· Clean the skin around the catheter at least once every day. °· Shower every day. Do not take baths. °· Put creams, lotions, or ointments on your genital area only as told by your doctor. °· Do not use powders, sprays, or lotions on your genital area. °How to clean your catheter and your skin °1. Wash your hands with soap and water. °2. Wet a washcloth in warm water and gentle (mild) soap. °3. Use the washcloth to clean the skin where the catheter enters your body. Clean downward and wipe away from the catheter in small circles. Do not wipe toward the catheter. °4. Pat the area dry with a clean towel. Make sure to clean off all soap. °How to care for your drainage bags °Empty your drainage bag when it is ?-½ full or at least 2-3 times a day. Replace your drainage bag once a month or sooner if it starts to smell bad or look dirty. Do not clean your drainage bag unless told by your doctor. °Emptying a drainage bag ° °Supplies Needed °· Rubbing alcohol. °· Gauze pad or cotton ball. °· Tape or a leg strap. ° °Steps °  Steps 1. Wash your hands with soap and water. 2. Separate (detach) the bag from your leg. 3. Hold the bag over the toilet or a clean container. Keep the bag below your hips and bladder. This stops pee (urine) from going back into the tube. 4. Open the pour spout at the bottom of the bag. 5. Empty the pee into the toilet or container. Do not let the pour spout touch any surface. 6. Put rubbing alcohol on a gauze pad or cotton ball. 7. Use the gauze pad or cotton ball to clean the pour spout. 8. Close the pour spout. 9. Attach the bag to your leg with  tape or a leg strap. 10. Wash your hands.  Changing a drainage bag Supplies Needed  Alcohol wipes.  A clean drainage bag.  Adhesive tape or a leg strap.  Steps 1. Wash your hands with soap and water. 2. Separate the dirty bag from your leg. 3. Pinch the rubber catheter with your fingers so that pee does not spill out. 4. Separate the catheter tube from the drainage tube where these tubes connect (at the connection valve). Do not let the tubes touch any surface. 5. Clean the end of the catheter tube with an alcohol wipe. Use a different alcohol wipe to clean the end of the drainage tube. 6. Connect the catheter tube to the drainage tube of the clean bag. 7. Attach the new bag to the leg with adhesive tape or a leg strap. 8. Wash your hands.  How to prevent infection and other problems  Never pull on your catheter or try to remove it. Pulling can damage tissue in your body.  Always wash your hands before and after touching your catheter.  If a leg strap gets wet, replace it with a dry one.  Drink enough fluids to keep your pee clear or pale yellow, or as told by your doctor.  Do not let the drainage bag or tubing touch the floor.  Wear cotton underwear.  If you are male, wipe from front to back after you poop (have a bowel movement).  Check on the catheter often to make sure it works and the tubing is not twisted. Get help if:  Your pee is cloudy.  Your pee smells unusually bad.  Your pee is not draining into the bag.  Your tube gets clogged.  Your catheter starts to leak.  Your bladder feels full. Get help right away if:  You have redness, swelling, or pain where the catheter enters your body.  You have fluid, pus, or a bad smell coming from the area where the catheter enters your body.  The area where the catheter enters your body feels warm.  You have a fever.  You have pain in your: ? Stomach (abdomen). ? Legs. ? Lower back. ? Bladder.  You see  blood fill the catheter.  Your pee is pink or red.  You feel sick to your stomach (nauseous).  You throw up (vomit).  You have chills.  Your catheter gets pulled out. This information is not intended to replace advice given to you by your health care provider. Make sure you discuss any questions you have with your health care provider. Document Released: 08/12/2012 Document Revised: 03/15/2016 Document Reviewed: 09/30/2013 Elsevier Interactive Patient Education  2018 [employer]. Ureteral Stent Implantation, Care After Refer to this sheet in the next few weeks. These instructions provide you with information about caring for yourself after your procedure. Your health care provider  may also give you more specific instructions. Your treatment has been planned according to current medical practices, but problems sometimes occur. Call your health care provider if you have any problems or questions after your procedure. What can I expect after the procedure? After the procedure, it is common to have:  Nausea.  Mild pain when you urinate. You may feel this pain in your lower back or lower abdomen. Pain should stop within a few minutes after you urinate. This may last for up to 1 week.  A small amount of blood in your urine for several days.  Follow these instructions at home:  Medicines  Take over-the-counter and prescription medicines only as told by your health care provider.  If you were prescribed an antibiotic medicine, take it as told by your health care provider. Do not stop taking the antibiotic even if you start to feel better.  Do not drive for 24 hours if you received a sedative.  Do not drive or operate heavy machinery while taking prescription pain medicines. Activity  Return to your normal activities as told by your health care provider. Ask your health care provider what activities are safe for you.  Do not lift anything that is heavier than 10 lb (4.5 kg). Follow  this limit for 1 week after your procedure, or for as long as told by your health care provider. General instructions  Watch for any blood in your urine. Call your health care provider if the amount of blood in your urine increases.  If you have a catheter: ? Follow instructions from your health care provider about taking care of your catheter and collection bag. ? Do not take baths, swim, or use a hot tub until your health care provider approves.  Drink enough fluid to keep your urine clear or pale yellow.  Keep all follow-up visits as told by your health care provider. This is important. Contact a health care provider if:  You have pain that gets worse or does not get better with medicine, especially pain when you urinate.  You have difficulty urinating.  You feel nauseous or you vomit repeatedly during a period of more than 2 days after the procedure. Get help right away if:  Your urine is dark red or has blood clots in it.  You are leaking urine (have incontinence).  The end of the stent comes out of your urethra.  You cannot urinate.  You have sudden, sharp, or severe pain in your abdomen or lower back.  You have a fever.  I will remove the stent in the office at your follow up appointment.    You may remove the foley catheter at home in the morning by cutting the side arm off.   The catheter should slide out easily after the balloon drains.   If you don't fell comfortable removing the catheter, call the office in the morning and tell them you need to come in to have it removed.  This information is not intended to replace advice given to you by your health care provider. Make sure you discuss any questions you have with your health care provider. Document Released: 12/18/2012 Document Revised: 09/23/2015 Document Reviewed: 10/30/2014 Elsevier Interactive Patient Education  [employer].

## 2017-08-23 NOTE — Transfer of Care (Signed)
Immediate Anesthesia Transfer of Care Note  Patient: Zachary Ingram  Procedure(s) Performed: TRANSURETHRAL RESECTION OF BLADDER TUMOR WITH POSSIBLE EPIRUBACIN (N/A ) CYSTOSCOPY WITH  RIGHT RETROGRADE AND RIGHT  STENT PLACEMENT (Right )  Patient Location: PACU  Anesthesia Type:General  Level of Consciousness: awake, alert , oriented and patient cooperative  Airway & Oxygen Therapy: Patient Spontanous Breathing and Patient connected to nasal cannula oxygen  Post-op Assessment: Report given to RN and Post -op Vital signs reviewed and stable  Post vital signs: Reviewed and stable  Last Vitals:  Vitals Value Taken Time  BP    Temp    Pulse    Resp    SpO2      Last Pain:  Vitals:   08/23/17 0957  TempSrc:   PainSc: 6          Complications: No apparent anesthesia complications

## 2017-08-23 NOTE — Interval H&P Note (Signed)
History and Physical Interval Note:  08/23/2017 10:07 AM  Zachary Ingram  has presented today for surgery, with the diagnosis of BLADDER TUMOR  The various methods of treatment have been discussed with the patient and family. After consideration of risks, benefits and other options for treatment, the patient has consented to  Procedure(s): TRANSURETHRAL RESECTION OF BLADDER TUMOR WITH POSSIBLE EPIRUBACIN (N/A) CYSTOSCOPY WITH POSSIBLE RIGHT RETROGRADE  POSSIBLE STENT PLACEMENT (Right) as a surgical intervention .  The patient's history has been reviewed, patient examined, no change in status, stable for surgery.  I have reviewed the patient's chart and labs.  Questions were answered to the patient's satisfaction.     [name]

## 2017-08-23 NOTE — Op Note (Signed)
Procedure: 1.  Transurethral resection of 2 cm bladder tumor from the right trigone. 2.  Cystoscopy with right retrograde pyelogram and interpretation. 3.  Insertion of right double-J stent.  Preop diagnosis: Bladder tumor at right trigone.  Postop diagnosis: 2 cm bladder tumor right trigone.  Surgeon: Dr. [name].  Anesthesia: General.  Drains: 1.  6 French by 24 cm right double-J stent. 2.  18 French Foley catheter.  Specimen: Bladder tumor chips.  EBL: None.  Complications: None.  Indications: Zachary Ingram is a 82 year old white male who was evaluated for hematuria and was found to have a lesion on CT scan involving the right trigone adjacent to the ureteral orifice.  He is to undergo cystoscopy with resection of the lesion and possible retrograde pyelogram and stent as well as possible epirubicin injection.  Procedure: He was taken to the operating room where he was given antibiotics.  A general anesthetic was induced.  He was placed in lithotomy position and fitted with PSAs.  His perineum and genitalia were prepped with Betadine solution and draped in usual sterile fashion.  Cystoscopy was performed using a 23 [place] scope and 30 and 70 degree lenses.  Examination revealed a normal urethra.  The external sphincter is intact.  The prostatic urethra was 2 to 3 cm in length with bilobar hyperplasia and some elevation of the bladder neck without amenable.  The bladder wall had mild to moderate trabeculation.  There was a papillary lesion of the right trigone obscuring the ureteral orifice.  It measured approximately 1.5 x 2 cm and there was an adherent clot.  The left ureteral orifice was unremarkable.  The cystoscope was removed and the 26 French continuous-flow resectoscope sheath was inserted using the visual obturator.  The obturator was replaced with the [facility] handle with a bipolar loop and 30 degrees lens.  Saline was used as the irrigant.  The lesion was then resected down to  the muscle and ureteral orifice was identified in the midst of the resection field.  There were no tumor fronds within the urethral lumen.  Hemostasis was achieved and the chips were evacuated.  The cystoscope was then reinserted with a 30 degree lens and a 5 French opening catheter was used to perform a right retrograde pyelogram.  Right retrograde pyelogram demonstrated some narrowing of the distal 1-1/2 cm of ureter but no filling defects.  There was no proximal dilation or filling defect.  A sensor guidewire was then inserted to the kidney under fluoroscopic guidance and a 6 French 24 cm Contour double-J stent was passed over the wire to the kidney without difficulty.  Wire was removed leaving a good coil in the kidney and a good coil in the bladder.  Final inspection revealed no retained chips or active bleeding.  The cystoscope was removed and an 43 French Foley catheter was inserted.  The balloon was filled with 10 cc of sterile fluid.  The patient was taken down from the lithotomy position, his anesthetic was reversed and he was moved to recovery room in stable condition.  There were no complications.  I felt epirubicin was not indicated because of the presence of the stent.

## 2017-08-23 NOTE — Anesthesia Procedure Notes (Signed)
Procedure Name: LMA Insertion Date/Time: 08/23/2017 10:40 AM Performed by: Wanita Chamberlain, CRNA Pre-anesthesia Checklist: Patient identified, Timeout performed, Emergency Drugs available, Suction available and Patient being monitored Patient Re-evaluated:Patient Re-evaluated prior to induction Oxygen Delivery Method: Circle system utilized Preoxygenation: Pre-oxygenation with 100% oxygen Induction Type: IV induction Ventilation: Mask ventilation without difficulty LMA: LMA inserted LMA Size: 4.0 Number of attempts: 1 Placement Confirmation: CO2 detector,  breath sounds checked- equal and bilateral and positive ETCO2 Tube secured with: Tape Dental Injury: Teeth and Oropharynx as per pre-operative assessment

## 2017-08-23 NOTE — Anesthesia Preprocedure Evaluation (Addendum)
Anesthesia Evaluation  Patient identified by MRN, date of birth, ID band Patient awake    Reviewed: Allergy & Precautions, H&P , NPO status , Patient's Chart, lab work & pertinent test results, reviewed documented beta blocker date and time   Airway Mallampati: I  TM Distance: >3 FB Neck ROM: full    Dental no notable dental hx. (+) Partial Lower, Missing, Caps,    Pulmonary neg pulmonary ROS,    Pulmonary exam normal breath sounds clear to auscultation       Cardiovascular Exercise Tolerance: Good hypertension, Pt. on medications negative cardio ROS   Rhythm:regular Rate:Normal     Neuro/Psych negative neurological ROS  negative psych ROS   GI/Hepatic Neg liver ROS,   Endo/Other  negative endocrine ROS  Renal/GU negative Renal ROS  negative genitourinary   Musculoskeletal   Abdominal   Peds  Hematology negative hematology ROS (+)   Anesthesia Other Findings   Reproductive/Obstetrics negative OB ROS                             Anesthesia Physical Anesthesia Plan  ASA: II  Anesthesia Plan: General   Post-op Pain Management:    Induction: Intravenous  PONV Risk Score and Plan: 2 and Ondansetron and Treatment may vary due to age or medical condition  Airway Management Planned: LMA and Oral ETT  Additional Equipment:   Intra-op Plan:   Post-operative Plan:   Informed Consent: I have reviewed the patients History and Physical, chart, labs and discussed the procedure including the risks, benefits and alternatives for the proposed anesthesia with the patient or authorized representative who has indicated his/her understanding and acceptance.   Dental Advisory Given  Plan Discussed with: CRNA, Surgeon and Anesthesiologist  Anesthesia Plan Comments: ( )       Anesthesia Quick Evaluation

## 2017-08-23 NOTE — Anesthesia Postprocedure Evaluation (Signed)
Anesthesia Post Note  Patient: VANNIE HOCHSTETLER  Procedure(s) Performed: TRANSURETHRAL RESECTION OF BLADDER TUMOR WITH POSSIBLE EPIRUBACIN (N/A ) CYSTOSCOPY WITH  RIGHT RETROGRADE AND RIGHT  STENT PLACEMENT (Right )     Patient location during evaluation: PACU Anesthesia Type: General Level of consciousness: awake and alert Pain management: pain level controlled Vital Signs Assessment: post-procedure vital signs reviewed and stable Respiratory status: spontaneous breathing, nonlabored ventilation, respiratory function stable and patient connected to nasal cannula oxygen Cardiovascular status: blood pressure returned to baseline and stable Postop Assessment: no apparent nausea or vomiting Anesthetic complications: no    Last Vitals:  Vitals:   08/23/17 1200 08/23/17 1209  BP: (!) 146/80 (!) 155/80  Pulse: 72 (!) 59  Resp: (!) 21 (!) 22  Temp:  36.6 C  SpO2: 96% 99%    Last Pain:  Vitals:   08/23/17 1145  TempSrc:   PainSc: 0-No pain                 Grae Leathers

## 2017-08-24 ENCOUNTER — Encounter (HOSPITAL_BASED_OUTPATIENT_CLINIC_OR_DEPARTMENT_OTHER): Payer: Self-pay | Admitting: Urology

## 2017-09-03 DIAGNOSIS — R3121 Asymptomatic microscopic hematuria: Secondary | ICD-10-CM | POA: Diagnosis not present

## 2017-09-03 DIAGNOSIS — C670 Malignant neoplasm of trigone of bladder: Secondary | ICD-10-CM | POA: Diagnosis not present

## 2017-10-15 DIAGNOSIS — R3121 Asymptomatic microscopic hematuria: Secondary | ICD-10-CM | POA: Diagnosis not present

## 2017-12-05 DIAGNOSIS — Z8551 Personal history of malignant neoplasm of bladder: Secondary | ICD-10-CM | POA: Diagnosis not present

## 2017-12-07 DIAGNOSIS — H6123 Impacted cerumen, bilateral: Secondary | ICD-10-CM | POA: Diagnosis not present

## 2017-12-10 DIAGNOSIS — H35372 Puckering of macula, left eye: Secondary | ICD-10-CM | POA: Diagnosis not present

## 2017-12-21 DIAGNOSIS — Z0000 Encounter for general adult medical examination without abnormal findings: Secondary | ICD-10-CM | POA: Diagnosis not present

## 2017-12-24 DIAGNOSIS — E782 Mixed hyperlipidemia: Secondary | ICD-10-CM | POA: Diagnosis not present

## 2017-12-24 DIAGNOSIS — R319 Hematuria, unspecified: Secondary | ICD-10-CM | POA: Diagnosis not present

## 2017-12-24 DIAGNOSIS — R7301 Impaired fasting glucose: Secondary | ICD-10-CM | POA: Diagnosis not present

## 2017-12-24 DIAGNOSIS — I10 Essential (primary) hypertension: Secondary | ICD-10-CM | POA: Diagnosis not present

## 2017-12-28 DIAGNOSIS — I10 Essential (primary) hypertension: Secondary | ICD-10-CM | POA: Diagnosis not present

## 2017-12-28 DIAGNOSIS — Z0000 Encounter for general adult medical examination without abnormal findings: Secondary | ICD-10-CM | POA: Diagnosis not present

## 2017-12-28 DIAGNOSIS — C670 Malignant neoplasm of trigone of bladder: Secondary | ICD-10-CM | POA: Diagnosis not present

## 2017-12-28 DIAGNOSIS — R7301 Impaired fasting glucose: Secondary | ICD-10-CM | POA: Diagnosis not present

## 2017-12-28 DIAGNOSIS — Z23 Encounter for immunization: Secondary | ICD-10-CM | POA: Diagnosis not present

## 2017-12-28 DIAGNOSIS — E782 Mixed hyperlipidemia: Secondary | ICD-10-CM | POA: Diagnosis not present

## 2018-03-15 DIAGNOSIS — Z974 Presence of external hearing-aid: Secondary | ICD-10-CM | POA: Diagnosis not present

## 2018-06-26 ENCOUNTER — Other Ambulatory Visit: Payer: Self-pay | Admitting: Urology

## 2018-06-26 DIAGNOSIS — C671 Malignant neoplasm of dome of bladder: Secondary | ICD-10-CM | POA: Diagnosis not present

## 2018-06-26 DIAGNOSIS — C672 Malignant neoplasm of lateral wall of bladder: Secondary | ICD-10-CM | POA: Diagnosis not present

## 2018-07-04 ENCOUNTER — Encounter (HOSPITAL_BASED_OUTPATIENT_CLINIC_OR_DEPARTMENT_OTHER): Payer: Self-pay | Admitting: *Deleted

## 2018-07-04 ENCOUNTER — Other Ambulatory Visit: Payer: Self-pay

## 2018-07-04 NOTE — Progress Notes (Signed)
SPOKE WITH Zachary Ingram NPO AFTER MIDNIGHT, ARRIVE 700 AM 07-09-18 [place] NO MEDS TO TAKE EKG 08-23-17 ON CHART/EPIC NEED I STAT 4 HAS SURGERY ORDERS IN Epic DRIVER SON Zachary Ingram [facility]

## 2018-07-08 NOTE — H&P (Signed)
CC: I have bladder cancer.  HPI: Zachary Ingram is a 83 year-old male established patient who is here for bladder cancer.  His problem was diagnosed 08/23/2017.   Zachary Ingram returns today in f/u. He has a history of low grade NMIBC and had a TURBT on 08/23/17 for a 2.5cm right trigone tumor that required resection around the orifice and a stent. He is doing well. He has had no gross hematuria. He had a renal US about 6 weeks after stent removal and it showed no hydro. UA has microhematuria.      CC: AUA Questions Scoring.  HPI:     AUA Symptom Score: He never has the sensation of not emptying his bladder completely after finishing urinating. He never has to urinate again less that two hours after he has finished urinating. He does not have to stop and start again several times when he urinates. He never finds it difficult to postpone urination. He never has a weak urinary stream. He never has to push or strain to begin urination. He never has to get up to urinate from the time he goes to bed until the time he gets up in the morning.   Calculated AUA Symptom Score: 0    ALLERGIES: No Allergies    MEDICATIONS: Aspirin Ec 81 mg tablet, delayed release Oral  Fish Oil CAPS Oral  Lisinopril 10 mg tablet Oral  Lovastatin 40 mg tablet     GU PSH: Cysto Remove Stent FB Sim - 09/03/2017 Cystoscopy - 12/05/2017 Cystoscopy Insert Stent, Right - 08/23/2017 Cystoscopy TURBT 2-5 cm - 08/23/2017 Locm 300-399Mg /Ml Iodine,1Ml - 08/07/2017 Vasectomy - 1982      PSH Notes: Jaw Surgery   NON-GU PSH: No Non-GU PSH    GU PMH: History of bladder cancer, He has no recurrent disease. I will have him return in 6 months for cystoscopy. - 12/05/2017 Bladder Cancer Trigone , He had low grade superficial disease. I will have him return in 6 weeks for a renal US and in 3 months for cystoscopy. - 09/03/2017, He has an 13mm right trigonal lesion that is worrisome for UCCa. I am going to get him set up for cystoscopy with TURBT,  possible right stent and possible epirubicin instillation. I reviewed the risks of bleeding, infection, bladder injury, need for a stent and secondary procedures, chemical cystitis, thrombotic events and anesthetic complications. , - 08/09/2017 Microscopic hematuria (Stable), I will get culture today. - 09/03/2017, He has significant microhematuria and needs eval with CT and possible cystoscopy. , - 07/30/2017 Renal calculus - 08/09/2017 BPH w/LUTS, Benign prostatic hyperplasia with urinary obstruction - Aug 09, 2014 Dysuria, Dysuria - 2014/08/09 Nocturia, Nocturia - 08-09-14 Unil Inguinal Hernia W/O obst or gang,non-recurrent, Inguinal hernia, right - 09-Aug-2014 Urinary Retention, Unspec, Urinary retention - 08/08/2013 Gross hematuria, Gross hematuria - 2013-08-08    NON-GU PMH: Solitary pulmonary nodule, He will need a Chest CT for further evaluation. I will discuss that with him at his post op visit. - 08/09/2017 Encounter for general adult medical examination without abnormal findings, Encounter for preventive health examination - 2014-08-09 Personal history of other diseases of the circulatory system, History of hypertension - 2013-08-08 Personal history of other endocrine, nutritional and metabolic disease, History of hypercholesterolemia - August 08, 2013    FAMILY HISTORY: Death of family member - Runs In Family   SOCIAL HISTORY: Marital Status: Married Preferred Language: English; Race: White Current Smoking Status: Patient has never smoked.   Tobacco Use Assessment Completed: Used Tobacco in last 30 days?  Drinks 3 caffeinated drinks per day.     Notes: Retired, Divorced, No alcohol use, Never a smoker, Daily caffeine consumption, 2-3 servings a day, One child   REVIEW OF SYSTEMS:    GU Review Male:   Patient denies frequent urination, hard to postpone urination, burning/ pain with urination, get up at night to urinate, leakage of urine, stream starts and stops, trouble starting your stream, have to strain to urinate , erection problems, and  penile pain.  Gastrointestinal (Upper):   Patient denies nausea, vomiting, and indigestion/ heartburn.  Gastrointestinal (Lower):   Patient denies diarrhea and constipation.  Constitutional:   Patient denies fever, night sweats, weight loss, and fatigue.  Skin:   Patient denies skin rash/ lesion and itching.  Eyes:   Patient denies blurred vision and double vision.  Ears/ Nose/ Throat:   Patient denies sore throat and sinus problems.  Hematologic/Lymphatic:   Patient denies swollen glands and easy bruising.  Cardiovascular:   Patient denies chest pains and leg swelling.  Respiratory:   Patient denies cough and shortness of breath.  Endocrine:   Patient denies excessive thirst.  Musculoskeletal:   Patient denies back pain and joint pain.  Neurological:   Patient denies headaches and dizziness.  Psychologic:   Patient denies depression and anxiety.   VITAL SIGNS:      06/26/2018 12:40 PM  BP 139/77 mmHg  Heart Rate 64 /min  Temperature 97.8 F / 36.5 C   MULTI-SYSTEM PHYSICAL EXAMINATION:    Constitutional: Well-nourished. No physical deformities. Normally developed. Good grooming.  Respiratory: Normal breath sounds. No labored breathing, no use of accessory muscles.   Cardiovascular: Regular rate and rhythm. No murmur, no gallop.     PAST DATA REVIEWED:  Source Of History:  Patient  Records Review:   AUA Symptom Score  Urine Test Review:   Urinalysis   09/24/14  PSA  Total PSA 2.16     PROCEDURES:         Flexible Cystoscopy - 52000  Risks, benefits, and some of the potential complications of the procedure were discussed. 5ml of 2% lidocaine jelly was instilled intraurethrally.     Meatus:  Normal size. Normal location. Normal condition.  Urethra:  No strictures.  External Sphincter:  Normal.  Verumontanum:  Normal.  Prostate:  Obstructing. Moderate hyperplasia.  Bladder Neck:  Non-obstructing.  Ureteral Orifices:  Normal location. Normal size. Normal shape. Effluxed  clear urine.  Bladder:  Moderate trabeculation. A few left lateral wall tumors. A dome tumor. Normal mucosa. No stones. lesions are 5 -58mm on the right dome, left dome and lateral wall. 4 lesions noted.       The procedure was well tolerated and there were no complications.         Urinalysis w/Scope Dipstick Dipstick Cont'd Micro  Color: Yellow Bilirubin: Neg mg/dL WBC/hpf: NS (Not Seen)  Appearance: Clear Ketones: Neg mg/dL RBC/hpf: 10 - 20/hpf  Specific Gravity: 1.025 Blood: 2+ ery/uL Bacteria: Few (10-25/hpf)  pH: <=5.0 Protein: 1+ mg/dL Cystals: NS (Not Seen)  Glucose: Neg mg/dL Urobilinogen: 0.2 mg/dL Casts: NS (Not Seen)    Nitrites: Neg Trichomonas: Not Present    Leukocyte Esterase: Neg leu/uL Mucous: Not Present      Epithelial Cells: 0 - 5/hpf      Yeast: NS (Not Seen)      Sperm: Not Present    ASSESSMENT:      ICD-10 Details  1 GU:   Bladder Cancer Dome - C67.1 He  has multiple small recurrent tumors. I will get him set up for cystoscopy, bil RTG's, TURBT and instillation of gemcitabine. I reviewed the risks of bleeding, infection, bladder injury, need for a stent or secondary procedures, chemical cystitis, thrombotic events and anesthetic complications.   2   Bladder Cancer Lateral - C67.2    PLAN:           Schedule Return Visit/Planned Activity: Next Available Appointment - Schedule Surgery          Document Letter(s):  Created for Patient: Clinical Summary         Next Appointment:      Next Appointment: 07/09/2018 09:00 AM    Appointment Type: Surgery     Location: [facility] [phone] [id number]    Provider: [name], M.D.    Reason for Visit: OP NE CYSTO BIL RTG TRUBT POSS GEM

## 2018-07-09 ENCOUNTER — Other Ambulatory Visit: Payer: Self-pay

## 2018-07-09 ENCOUNTER — Ambulatory Visit (HOSPITAL_BASED_OUTPATIENT_CLINIC_OR_DEPARTMENT_OTHER): Payer: Medicare Other | Admitting: Anesthesiology

## 2018-07-09 ENCOUNTER — Encounter (HOSPITAL_BASED_OUTPATIENT_CLINIC_OR_DEPARTMENT_OTHER): Payer: Self-pay | Admitting: *Deleted

## 2018-07-09 ENCOUNTER — Encounter (HOSPITAL_BASED_OUTPATIENT_CLINIC_OR_DEPARTMENT_OTHER)
Admission: RE | Disposition: A | Discharge status: Home / Self Care | Payer: Self-pay | Source: Home / Self Care | Attending: Urology

## 2018-07-09 ENCOUNTER — Ambulatory Visit (HOSPITAL_BASED_OUTPATIENT_CLINIC_OR_DEPARTMENT_OTHER)
Admission: RE | Admit: 2018-07-09 | Discharge: 2018-07-09 | Disposition: A | Discharge status: Home / Self Care | Payer: Medicare Other | Attending: Urology | Admitting: Urology

## 2018-07-09 DIAGNOSIS — D494 Neoplasm of unspecified behavior of bladder: Secondary | ICD-10-CM | POA: Diagnosis not present

## 2018-07-09 DIAGNOSIS — C670 Malignant neoplasm of trigone of bladder: Secondary | ICD-10-CM | POA: Diagnosis not present

## 2018-07-09 DIAGNOSIS — C671 Malignant neoplasm of dome of bladder: Secondary | ICD-10-CM | POA: Insufficient documentation

## 2018-07-09 DIAGNOSIS — Z79899 Other long term (current) drug therapy: Secondary | ICD-10-CM | POA: Insufficient documentation

## 2018-07-09 DIAGNOSIS — Z8551 Personal history of malignant neoplasm of bladder: Secondary | ICD-10-CM | POA: Diagnosis not present

## 2018-07-09 DIAGNOSIS — E785 Hyperlipidemia, unspecified: Secondary | ICD-10-CM | POA: Diagnosis not present

## 2018-07-09 DIAGNOSIS — C672 Malignant neoplasm of lateral wall of bladder: Secondary | ICD-10-CM | POA: Insufficient documentation

## 2018-07-09 DIAGNOSIS — Z7982 Long term (current) use of aspirin: Secondary | ICD-10-CM | POA: Diagnosis not present

## 2018-07-09 DIAGNOSIS — R3129 Other microscopic hematuria: Secondary | ICD-10-CM | POA: Diagnosis not present

## 2018-07-09 DIAGNOSIS — I10 Essential (primary) hypertension: Secondary | ICD-10-CM | POA: Diagnosis not present

## 2018-07-09 HISTORY — PX: TRANSURETHRAL RESECTION OF BLADDER TUMOR WITH MITOMYCIN-C: SHX6459

## 2018-07-09 LAB — POCT I-STAT 4, (NA,K, GLUC, HGB,HCT)
Glucose, Bld: 130 mg/dL — ABNORMAL HIGH (ref 70–99)
HCT: 42 % (ref 39.0–52.0)
Hemoglobin: 14.3 g/dL (ref 13.0–17.0)
POTASSIUM: 4.3 mmol/L (ref 3.5–5.1)
Sodium: 140 mmol/L (ref 135–145)

## 2018-07-09 SURGERY — TRANSURETHRAL RESECTION OF BLADDER TUMOR WITH MITOMYCIN-C
Anesthesia: General | Site: Bladder | Laterality: Bilateral

## 2018-07-09 MED ORDER — FENTANYL CITRATE (PF) 100 MCG/2ML IJ SOLN
INTRAMUSCULAR | Status: AC
  Filled 2018-07-09: qty 2

## 2018-07-09 MED ORDER — LIDOCAINE 2% (20 MG/ML) 5 ML SYRINGE
INTRAMUSCULAR | Status: DC | PRN
  Administered 2018-07-09: 60 mg via INTRAVENOUS

## 2018-07-09 MED ORDER — STERILE WATER FOR IRRIGATION IR SOLN
Status: DC | PRN
  Administered 2018-07-09: 3000 mL

## 2018-07-09 MED ORDER — OXYCODONE HCL 5 MG PO TABS
5.0000 mg | ORAL_TABLET | ORAL | Status: DC | PRN
  Filled 2018-07-09: qty 2

## 2018-07-09 MED ORDER — OXYCODONE HCL 5 MG/5ML PO SOLN
5.0000 mg | Freq: Once | ORAL | Status: DC | PRN
  Filled 2018-07-09: qty 5

## 2018-07-09 MED ORDER — MORPHINE SULFATE (PF) 2 MG/ML IV SOLN
2.0000 mg | INTRAVENOUS | Status: DC | PRN
  Filled 2018-07-09: qty 1

## 2018-07-09 MED ORDER — EPHEDRINE SULFATE-NACL 50-0.9 MG/10ML-% IV SOSY
PREFILLED_SYRINGE | INTRAVENOUS | Status: DC | PRN
  Administered 2018-07-09 (×2): 10 mg via INTRAVENOUS

## 2018-07-09 MED ORDER — PROPOFOL 10 MG/ML IV BOLUS
INTRAVENOUS | Status: DC | PRN
  Administered 2018-07-09: 120 mg via INTRAVENOUS

## 2018-07-09 MED ORDER — SODIUM CHLORIDE 0.9% FLUSH
3.0000 mL | INTRAVENOUS | Status: DC | PRN
  Filled 2018-07-09: qty 3

## 2018-07-09 MED ORDER — ACETAMINOPHEN 325 MG PO TABS
650.0000 mg | ORAL_TABLET | ORAL | Status: DC | PRN
  Filled 2018-07-09: qty 2

## 2018-07-09 MED ORDER — SODIUM CHLORIDE 0.9 % IV SOLN
250.0000 mL | INTRAVENOUS | Status: DC | PRN
  Filled 2018-07-09: qty 250

## 2018-07-09 MED ORDER — ONDANSETRON HCL 4 MG/2ML IJ SOLN
INTRAMUSCULAR | Status: AC
  Filled 2018-07-09: qty 2

## 2018-07-09 MED ORDER — LACTATED RINGERS IV SOLN
INTRAVENOUS | Status: DC
  Administered 2018-07-09 (×2): via INTRAVENOUS
  Administered 2018-07-09: 1000 mL via INTRAVENOUS
  Filled 2018-07-09: qty 1000

## 2018-07-09 MED ORDER — DEXAMETHASONE SODIUM PHOSPHATE 10 MG/ML IJ SOLN
INTRAMUSCULAR | Status: AC
  Filled 2018-07-09: qty 1

## 2018-07-09 MED ORDER — ONDANSETRON HCL 4 MG/2ML IJ SOLN
4.0000 mg | Freq: Once | INTRAMUSCULAR | Status: DC | PRN
  Filled 2018-07-09: qty 2

## 2018-07-09 MED ORDER — ACETAMINOPHEN 160 MG/5ML PO SOLN
325.0000 mg | ORAL | Status: DC | PRN
  Filled 2018-07-09: qty 20.3

## 2018-07-09 MED ORDER — PROPOFOL 10 MG/ML IV BOLUS
INTRAVENOUS | Status: AC
  Filled 2018-07-09: qty 20

## 2018-07-09 MED ORDER — SODIUM CHLORIDE 0.9 % IV SOLN
INTRAVENOUS | Status: DC | PRN
  Administered 2018-07-09: 20 mL

## 2018-07-09 MED ORDER — GEMCITABINE CHEMO FOR BLADDER INSTILLATION 2000 MG
2000.0000 mg | Freq: Once | INTRAVENOUS | Status: AC
  Administered 2018-07-09: 2000 mg via INTRAVESICAL
  Filled 2018-07-09: qty 2000

## 2018-07-09 MED ORDER — KETOROLAC TROMETHAMINE 30 MG/ML IJ SOLN
INTRAMUSCULAR | Status: AC
  Filled 2018-07-09: qty 1

## 2018-07-09 MED ORDER — DEXAMETHASONE SODIUM PHOSPHATE 10 MG/ML IJ SOLN
INTRAMUSCULAR | Status: DC | PRN
  Administered 2018-07-09: 4 mg via INTRAVENOUS

## 2018-07-09 MED ORDER — OXYCODONE HCL 5 MG PO TABS
5.0000 mg | ORAL_TABLET | Freq: Once | ORAL | Status: DC | PRN
  Filled 2018-07-09: qty 1

## 2018-07-09 MED ORDER — MEPERIDINE HCL 25 MG/ML IJ SOLN
6.2500 mg | INTRAMUSCULAR | Status: DC | PRN
  Filled 2018-07-09: qty 1

## 2018-07-09 MED ORDER — CEFAZOLIN SODIUM-DEXTROSE 2-4 GM/100ML-% IV SOLN
2.0000 g | INTRAVENOUS | Status: AC
  Administered 2018-07-09: 2 g via INTRAVENOUS
  Filled 2018-07-09: qty 100

## 2018-07-09 MED ORDER — SODIUM CHLORIDE 0.9 % IR SOLN
Status: DC | PRN
  Administered 2018-07-09: 3000 mL

## 2018-07-09 MED ORDER — ONDANSETRON HCL 4 MG/2ML IJ SOLN
INTRAMUSCULAR | Status: DC | PRN
  Administered 2018-07-09: 4 mg via INTRAVENOUS

## 2018-07-09 MED ORDER — LIDOCAINE 2% (20 MG/ML) 5 ML SYRINGE
INTRAMUSCULAR | Status: AC
  Filled 2018-07-09: qty 5

## 2018-07-09 MED ORDER — FENTANYL CITRATE (PF) 100 MCG/2ML IJ SOLN
25.0000 ug | INTRAMUSCULAR | Status: DC | PRN
  Administered 2018-07-09: 25 ug via INTRAVENOUS
  Administered 2018-07-09: 50 ug via INTRAVENOUS
  Administered 2018-07-09: 25 ug via INTRAVENOUS
  Filled 2018-07-09: qty 1

## 2018-07-09 MED ORDER — TRAMADOL HCL 50 MG PO TABS: 50.0000 mg | ORAL_TABLET | Freq: Four times a day (QID) | ORAL | 0 refills | Status: DC | PRN

## 2018-07-09 MED ORDER — SODIUM CHLORIDE 0.9% FLUSH
3.0000 mL | Freq: Two times a day (BID) | INTRAVENOUS | Status: DC
  Filled 2018-07-09: qty 3

## 2018-07-09 MED ORDER — FENTANYL CITRATE (PF) 100 MCG/2ML IJ SOLN
INTRAMUSCULAR | Status: DC | PRN
  Administered 2018-07-09: 50 ug via INTRAVENOUS
  Administered 2018-07-09 (×2): 25 ug via INTRAVENOUS

## 2018-07-09 MED ORDER — CEFAZOLIN SODIUM-DEXTROSE 2-4 GM/100ML-% IV SOLN
INTRAVENOUS | Status: AC
  Filled 2018-07-09: qty 100

## 2018-07-09 MED ORDER — ACETAMINOPHEN 650 MG RE SUPP
650.0000 mg | RECTAL | Status: DC | PRN
  Filled 2018-07-09: qty 1

## 2018-07-09 MED ORDER — ACETAMINOPHEN 325 MG PO TABS
325.0000 mg | ORAL_TABLET | ORAL | Status: DC | PRN
  Filled 2018-07-09: qty 2

## 2018-07-09 MED ORDER — EPHEDRINE 5 MG/ML INJ
INTRAVENOUS | Status: AC
  Filled 2018-07-09: qty 10

## 2018-07-09 SURGICAL SUPPLY — 27 items
BAG DRAIN URO-CYSTO SKYTR STRL (DRAIN) ×3 IMPLANT
BAG URINE DRAINAGE (UROLOGICAL SUPPLIES) ×3 IMPLANT
BAG URINE LEG 19OZ MD ST LTX (BAG) IMPLANT
CATH FOLEY 2WAY SLVR  5CC 16FR (CATHETERS) ×2
CATH FOLEY 2WAY SLVR  5CC 20FR (CATHETERS)
CATH FOLEY 2WAY SLVR  5CC 22FR (CATHETERS)
CATH FOLEY 2WAY SLVR 5CC 16FR (CATHETERS) ×1 IMPLANT
CATH FOLEY 2WAY SLVR 5CC 20FR (CATHETERS) IMPLANT
CATH FOLEY 2WAY SLVR 5CC 22FR (CATHETERS) IMPLANT
CLOTH BEACON ORANGE TIMEOUT ST (SAFETY) ×3 IMPLANT
ELECT REM PT RETURN 9FT ADLT (ELECTROSURGICAL)
ELECTRODE REM PT RTRN 9FT ADLT (ELECTROSURGICAL) IMPLANT
GLOVE BIO SURGEON STRL SZ8.5 (GLOVE) ×3 IMPLANT
GLOVE BIOGEL PI IND STRL 8.5 (GLOVE) ×1 IMPLANT
GLOVE BIOGEL PI INDICATOR 8.5 (GLOVE) ×2
GLOVE SURG SS PI 8.0 STRL IVOR (GLOVE) ×3 IMPLANT
GOWN STRL REUS W/TWL XL LVL3 (GOWN DISPOSABLE) ×6 IMPLANT
HOLDER FOLEY CATH W/STRAP (MISCELLANEOUS) ×3 IMPLANT
IV NS IRRIG 3000ML ARTHROMATIC (IV SOLUTION) ×3 IMPLANT
KIT TURNOVER CYSTO (KITS) ×3 IMPLANT
MANIFOLD NEPTUNE II (INSTRUMENTS) ×3 IMPLANT
PACK CYSTO (CUSTOM PROCEDURE TRAY) ×3 IMPLANT
PLUG CATH AND CAP STER (CATHETERS) IMPLANT
SYRINGE IRR TOOMEY STRL 70CC (SYRINGE) IMPLANT
TUBE CONNECTING 12'X1/4 (SUCTIONS) ×1
TUBE CONNECTING 12X1/4 (SUCTIONS) ×2 IMPLANT
TUBING UROLOGY SET (TUBING) ×3 IMPLANT

## 2018-07-09 NOTE — Transfer of Care (Signed)
I  Last Vitals:  Vitals Value Taken Time  BP    Temp    Pulse    Resp    SpO2      Last Pain:  Vitals:   07/09/18 0715  TempSrc:   PainSc: 0-No pain      Patients Stated Pain Goal: 7 (07/09/18 0715)  Immediate Anesthesia Transfer of Care Note  Patient: ADIN LAKER  Procedure(s) Performed: Procedure(s) (LRB): CYSTOSCOPY  BILATERAL RETROGRADES TRANSURETHRAL RESECTION OF BLADDER TUMOR POSSIBLE WITH GEMCITABINE (Bilateral)  Patient Location: PACU  Anesthesia Type: General  Level of Consciousness: awake, alert  and oriented  Airway & Oxygen Therapy: Patient Spontanous Breathing and Patient connected to nasal cannula oxygen  Post-op Assessment: Report given to PACU RN and Post -op Vital signs reviewed and stable  Post vital signs: Reviewed and stable  Complications: No apparent anesthesia complications

## 2018-07-09 NOTE — Anesthesia Procedure Notes (Signed)
Procedure Name: LMA Insertion Date/Time: 07/09/2018 8:41 AM Performed by: Janeece Riggers, MD Pre-anesthesia Checklist: Patient identified, Emergency Drugs available, Suction available and Patient being monitored Patient Re-evaluated:Patient Re-evaluated prior to induction Oxygen Delivery Method: Circle system utilized Preoxygenation: Pre-oxygenation with 100% oxygen Induction Type: IV induction Ventilation: Mask ventilation without difficulty LMA: LMA inserted LMA Size: 5.0 Number of attempts: 1 Airway Equipment and Method: Bite block Placement Confirmation: positive ETCO2 Tube secured with: Tape Dental Injury: Teeth and Oropharynx as per pre-operative assessment

## 2018-07-09 NOTE — Anesthesia Postprocedure Evaluation (Signed)
Anesthesia Post Note  Patient: Zachary Ingram  Procedure(s) Performed: CYSTOSCOPY  BILATERAL RETROGRADES TRANSURETHRAL RESECTION OF BLADDER TUMOR POSSIBLE WITH GEMCITABINE (Bilateral Bladder)     Patient location during evaluation: PACU Anesthesia Type: General Level of consciousness: awake and alert Pain management: pain level controlled Vital Signs Assessment: post-procedure vital signs reviewed and stable Respiratory status: spontaneous breathing, nonlabored ventilation, respiratory function stable and patient connected to nasal cannula oxygen Cardiovascular status: blood pressure returned to baseline and stable Postop Assessment: no apparent nausea or vomiting Anesthetic complications: no    Last Vitals:  Vitals:   07/09/18 0930 07/09/18 0945  BP: 133/73 (!) 142/77  Pulse: 87 84  Resp: 20 17  Temp:    SpO2: 98% 98%    Last Pain:  Vitals:   07/09/18 0930  TempSrc:   PainSc: 9                  Delmo Matty

## 2018-07-09 NOTE — Anesthesia Preprocedure Evaluation (Addendum)
Anesthesia Evaluation  Patient identified by MRN, date of birth, ID band Patient awake    Reviewed: Allergy & Precautions, H&P , NPO status , Patient's Chart, lab work & pertinent test results, reviewed documented beta blocker date and time   Airway Mallampati: I  TM Distance: >3 FB Neck ROM: full    Dental no notable dental hx. (+) Partial Lower, Missing, Caps,    Pulmonary neg pulmonary ROS,    Pulmonary exam normal breath sounds clear to auscultation       Cardiovascular Exercise Tolerance: Good hypertension, Pt. on medications negative cardio ROS   Rhythm:regular Rate:Normal     Neuro/Psych negative neurological ROS  negative psych ROS   GI/Hepatic Neg liver ROS, hiatal hernia,   Endo/Other  negative endocrine ROS  Renal/GU negative Renal ROS  negative genitourinary   Musculoskeletal   Abdominal   Peds  Hematology negative hematology ROS (+)   Anesthesia Other Findings   Reproductive/Obstetrics negative OB ROS                             Anesthesia Physical  Anesthesia Plan  ASA: II  Anesthesia Plan: General   Post-op Pain Management:    Induction: Intravenous  PONV Risk Score and Plan: 2 and Ondansetron and Treatment may vary due to age or medical condition  Airway Management Planned: LMA and Oral ETT  Additional Equipment:   Intra-op Plan:   Post-operative Plan:   Informed Consent: I have reviewed the patients History and Physical, chart, labs and discussed the procedure including the risks, benefits and alternatives for the proposed anesthesia with the patient or authorized representative who has indicated his/her understanding and acceptance.     Dental Advisory Given  Plan Discussed with: CRNA, Surgeon and Anesthesiologist  Anesthesia Plan Comments: ( )        Anesthesia Quick Evaluation

## 2018-07-09 NOTE — Op Note (Signed)
Procedure: 1.  Cystoscopy with bilateral retrograde pyelograms and interpretation. 2.  Cystoscopy with biopsy and fulguration of 6 mm trigone tumor. 3.  Cystoscopy with biopsy and fulguration of 3 mm left lateral wall tumor. 4.  Cystoscopy with fulguration of 8 mm, 5 mm and 3 mm dome tumors. 5.  Instillation of gemcitabine in the PACU.  Preop diagnosis: Multifocal recurrent bladder tumors.  Postop diagnosis: Same.  Surgeon: Dr. [name].  Anesthesia: General.  Specimen: Tumor biopsies from trigone and left lateral wall.  Drain: 59 [place] Foley catheter.  EBL: None.  Complications: None.  Indications: Zachary Ingram is an 83 year old male with a history of urothelial carcinoma of the bladder who was found on recent surveillance cystoscopy to have multiple small recurrent tumors on the trigone, left lateral wall and dome.  It was felt that cystoscopy with biopsy, fulguration, bilateral retrogrades and instillation of gemcitabine were indicated.  Procedure: He was given 2 g of Ancef.  A general anesthetic was induced.  He was placed in lithotomy position and fitted with PAS hose.  His perineum and genitalia were prepped with Betadine solution and draped in usual sterile fashion.  Cystoscopy was performed using a 23 [place] scope and 30 and 70 degree lenses.  Examination revealed a normal urethra.  The external sphincter was intact.  The prostatic urethra was approximately 3 cm in length with bilobar hyperplasia with some obstruction and a high bladder neck.  Examination of bladder demonstrated a normal left ureteral orifice.  The right ureteral orifice was laterally displaced from prior resection with surrounding stellate scar.  He had mild trabeculation.  There were some small cellules on the posterior wall.  In the mid trigone just superior to the ridge there was a 6 mm papillary tumor it appeared superficial.  On the anterior left lateral wall there was a 3 mm tumor.  On the dome there were 8, 5  and 3 mm tumors.  After thorough inspection of the bladder bilateral retrograde pyelography was performed with a 5 French opening catheter.  The right retrograde pyelogram revealed a normal ureter and intrarenal collecting system.  The left retrograde pyelogram revealed a normal ureter and intrarenal collecting system.  A cup biopsy forceps was used to biopsy the tumor on the trigone and a tumor on the left lateral wall.  I was unable to get the biopsy forceps to the lesions on the dome.  A Bugbee electrode was then used to fulgurate the biopsy sites in the trigone and left lateral wall and then using the 70 degree lens with the deflection bridge I fulgurated the tumors on the dome.  The cystoscope was then removed and an 22 French Foley catheter was inserted.  The balloon was filled with 10 mL of sterile fluid and the catheter was placed to straight drainage.  He was taken down from lithotomy position, the anesthetic was reversed and he was moved recovery in stable condition.  In the recovery room his bladder was instilled with 2000 mg of gemcitabine and 50 mL of diluent.  This was left indwelling for 1 hour.  The bladder was then drained and the Foley was removed.  There were no complications.

## 2018-07-09 NOTE — Discharge Instructions (Addendum)
CYSTOSCOPY HOME CARE INSTRUCTIONS ° °Activity: °Rest for the remainder of the day.  Do not drive or operate equipment today.  You may resume normal activities in one to two days as instructed by your physician.  ° °Meals: °Drink plenty of liquids and eat light foods such as gelatin or soup this evening.  You may return to a normal meal plan tomorrow. ° °Return to Work: °You may return to work in one to two days or as instructed by your physician. ° °Special Instructions / Symptoms: °Call your physician if any of these symptoms occur: ° ° -persistent or heavy bleeding ° -bleeding which continues after first few urination ° -large blood clots that are difficult to pass ° -urine stream diminishes or stops completely ° -fever equal to or higher than 101 degrees Farenheit. ° -cloudy urine with a strong, foul odor ° -severe pain ° °Females should always wipe from front to back after elimination.  You may feel some burning pain when you urinate.  This should disappear with time.  Applying moist heat to the lower abdomen or a hot tub bath may help relieve the pain.  ° ° °Post Anesthesia Home Care Instructions ° °Activity: °Get plenty of rest for the remainder of the day. A responsible individual must stay with you for 24 hours following the procedure.  °For the next 24 hours, DO NOT: °-Drive a car °-Operate machinery °-Drink alcoholic beverages °-Take any medication unless instructed by your physician °-Make any legal decisions or sign important papers. ° °Meals: °Start with liquid foods such as gelatin or soup. Progress to regular foods as tolerated. Avoid greasy, spicy, heavy foods. If nausea and/or vomiting occur, drink only clear liquids until the nausea and/or vomiting subsides. Call your physician if vomiting continues. ° °Special Instructions/Symptoms: °Your throat may feel dry or sore from the anesthesia or the breathing tube placed in your throat during surgery. If this causes discomfort, gargle with warm salt  water. The discomfort should disappear within 24 hours. ° ° °   ° ° ° °

## 2018-07-09 NOTE — Interval H&P Note (Signed)
History and Physical Interval Note:  07/09/2018 8:28 AM  Zachary Ingram  has presented today for surgery, with the diagnosis of BLADDER TUMOR.  The various methods of treatment have been discussed with the patient and family. After consideration of risks, benefits and other options for treatment, the patient has consented to  Procedure(s): CYSTOSCOPY  BILATERAL RETROGRADES TRANSURETHRAL RESECTION OF BLADDER TUMOR POSSIBLE WITH GEMCITABINE (Bilateral) as a surgical intervention.  The patient's history has been reviewed, patient examined, no change in status, stable for surgery.  I have reviewed the patient's chart and labs.  Questions were answered to the patient's satisfaction.     [name]

## 2018-07-10 ENCOUNTER — Encounter (HOSPITAL_BASED_OUTPATIENT_CLINIC_OR_DEPARTMENT_OTHER): Payer: Self-pay | Admitting: Urology

## 2018-07-17 DIAGNOSIS — C678 Malignant neoplasm of overlapping sites of bladder: Secondary | ICD-10-CM | POA: Diagnosis not present

## 2018-07-22 DIAGNOSIS — E782 Mixed hyperlipidemia: Secondary | ICD-10-CM | POA: Diagnosis not present

## 2018-07-22 DIAGNOSIS — I10 Essential (primary) hypertension: Secondary | ICD-10-CM | POA: Diagnosis not present

## 2018-07-22 DIAGNOSIS — R7301 Impaired fasting glucose: Secondary | ICD-10-CM | POA: Diagnosis not present

## 2018-07-29 DIAGNOSIS — C670 Malignant neoplasm of trigone of bladder: Secondary | ICD-10-CM | POA: Diagnosis not present

## 2018-07-29 DIAGNOSIS — R7301 Impaired fasting glucose: Secondary | ICD-10-CM | POA: Diagnosis not present

## 2018-07-29 DIAGNOSIS — E782 Mixed hyperlipidemia: Secondary | ICD-10-CM | POA: Diagnosis not present

## 2018-07-29 DIAGNOSIS — I10 Essential (primary) hypertension: Secondary | ICD-10-CM | POA: Diagnosis not present

## 2018-07-31 DIAGNOSIS — C678 Malignant neoplasm of overlapping sites of bladder: Secondary | ICD-10-CM | POA: Diagnosis not present

## 2018-07-31 DIAGNOSIS — Z5111 Encounter for antineoplastic chemotherapy: Secondary | ICD-10-CM | POA: Diagnosis not present

## 2018-08-07 DIAGNOSIS — C678 Malignant neoplasm of overlapping sites of bladder: Secondary | ICD-10-CM | POA: Diagnosis not present

## 2018-08-07 DIAGNOSIS — Z5111 Encounter for antineoplastic chemotherapy: Secondary | ICD-10-CM | POA: Diagnosis not present

## 2018-08-14 DIAGNOSIS — C678 Malignant neoplasm of overlapping sites of bladder: Secondary | ICD-10-CM | POA: Diagnosis not present

## 2018-08-14 DIAGNOSIS — Z5111 Encounter for antineoplastic chemotherapy: Secondary | ICD-10-CM | POA: Diagnosis not present

## 2018-08-21 DIAGNOSIS — Z5111 Encounter for antineoplastic chemotherapy: Secondary | ICD-10-CM | POA: Diagnosis not present

## 2018-08-21 DIAGNOSIS — C678 Malignant neoplasm of overlapping sites of bladder: Secondary | ICD-10-CM | POA: Diagnosis not present

## 2018-08-28 DIAGNOSIS — C678 Malignant neoplasm of overlapping sites of bladder: Secondary | ICD-10-CM | POA: Diagnosis not present

## 2018-08-28 DIAGNOSIS — Z5111 Encounter for antineoplastic chemotherapy: Secondary | ICD-10-CM | POA: Diagnosis not present

## 2018-09-04 DIAGNOSIS — C678 Malignant neoplasm of overlapping sites of bladder: Secondary | ICD-10-CM | POA: Diagnosis not present

## 2018-09-04 DIAGNOSIS — Z5111 Encounter for antineoplastic chemotherapy: Secondary | ICD-10-CM | POA: Diagnosis not present

## 2018-10-17 DIAGNOSIS — Z8551 Personal history of malignant neoplasm of bladder: Secondary | ICD-10-CM | POA: Diagnosis not present

## 2018-12-13 DIAGNOSIS — H35373 Puckering of macula, bilateral: Secondary | ICD-10-CM | POA: Diagnosis not present

## 2019-01-07 DIAGNOSIS — Z23 Encounter for immunization: Secondary | ICD-10-CM | POA: Diagnosis not present

## 2019-01-07 DIAGNOSIS — Z0000 Encounter for general adult medical examination without abnormal findings: Secondary | ICD-10-CM | POA: Diagnosis not present

## 2019-01-07 DIAGNOSIS — I10 Essential (primary) hypertension: Secondary | ICD-10-CM | POA: Diagnosis not present

## 2019-01-07 DIAGNOSIS — Z7189 Other specified counseling: Secondary | ICD-10-CM | POA: Diagnosis not present

## 2019-01-07 DIAGNOSIS — R7301 Impaired fasting glucose: Secondary | ICD-10-CM | POA: Diagnosis not present

## 2019-01-07 DIAGNOSIS — E782 Mixed hyperlipidemia: Secondary | ICD-10-CM | POA: Diagnosis not present

## 2019-01-13 DIAGNOSIS — I10 Essential (primary) hypertension: Secondary | ICD-10-CM | POA: Diagnosis not present

## 2019-01-13 DIAGNOSIS — R7301 Impaired fasting glucose: Secondary | ICD-10-CM | POA: Diagnosis not present

## 2019-01-13 DIAGNOSIS — E782 Mixed hyperlipidemia: Secondary | ICD-10-CM | POA: Diagnosis not present

## 2019-01-13 DIAGNOSIS — Z0000 Encounter for general adult medical examination without abnormal findings: Secondary | ICD-10-CM | POA: Diagnosis not present

## 2019-01-13 DIAGNOSIS — C670 Malignant neoplasm of trigone of bladder: Secondary | ICD-10-CM | POA: Diagnosis not present

## 2019-01-15 DIAGNOSIS — C671 Malignant neoplasm of dome of bladder: Secondary | ICD-10-CM | POA: Diagnosis not present

## 2019-01-16 ENCOUNTER — Other Ambulatory Visit: Payer: Self-pay | Admitting: Urology

## 2019-01-27 ENCOUNTER — Encounter (HOSPITAL_BASED_OUTPATIENT_CLINIC_OR_DEPARTMENT_OTHER): Payer: Self-pay | Admitting: *Deleted

## 2019-01-27 ENCOUNTER — Other Ambulatory Visit: Payer: Self-pay

## 2019-01-27 ENCOUNTER — Other Ambulatory Visit (HOSPITAL_COMMUNITY)
Admission: RE | Admit: 2019-01-27 | Discharge: 2019-01-27 | Disposition: A | Discharge status: Home / Self Care | Payer: Medicare Other | Source: Ambulatory Visit | Attending: Urology | Admitting: Urology

## 2019-01-27 DIAGNOSIS — Z01812 Encounter for preprocedural laboratory examination: Secondary | ICD-10-CM | POA: Diagnosis not present

## 2019-01-27 DIAGNOSIS — C679 Malignant neoplasm of bladder, unspecified: Secondary | ICD-10-CM | POA: Insufficient documentation

## 2019-01-27 DIAGNOSIS — Z20828 Contact with and (suspected) exposure to other viral communicable diseases: Secondary | ICD-10-CM | POA: Insufficient documentation

## 2019-01-27 NOTE — Progress Notes (Signed)
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 Patient denies shortness of breath, chest pain, fever, cough a this phone interview.

## 2019-01-28 LAB — NOVEL CORONAVIRUS, NAA (HOSP ORDER, SEND-OUT TO REF LAB; TAT 18-24 HRS): SARS-CoV-2, NAA: NOT DETECTED

## 2019-01-29 NOTE — H&P (Signed)
CC: I have bladder cancer.  HPI: Zachary Ingram is a 83 year-old male established patient who is here for bladder cancer.  His problem was diagnosed 08/23/2017.   Issai returns today for cystoscopy for surveillance of bladder cancer. he completed induction BCG on 09/04/18. He did well with the BCG with minimal side effects. He had a bladder biopsy and fulguration on 07/09/18. he had 5 small tumors and was given Gemcitabine. he is doing well with minimal voiding complaints or hematuria. His UA is clear today. His IPSS is 0.   He has a history of low grade NMIBC and had a TURBT on 08/23/17 for a 2.5cm right trigone tumor that required resection around the orifice and a stent.     AUA Symptom Score: He never has the sensation of not emptying his bladder completely after finishing urinating. He never has to urinate again less that two hours after he has finished urinating. He does not have to stop and start again several times when he urinates. He never finds it difficult to postpone urination. He never has a weak urinary stream. He never has to push or strain to begin urination. He never has to get up to urinate from the time he goes to bed until the time he gets up in the morning.   Calculated AUA Symptom Score: 0    ALLERGIES: No Allergies    MEDICATIONS: Aspirin Ec 81 mg tablet, delayed release Oral  Fish Oil CAPS Oral  Lisinopril 10 mg tablet Oral  Lovastatin 40 mg tablet     GU PSH: Bladder Instill AntiCA Agent - 09/04/2018, 08/28/2018, 08/21/2018, 08/14/2018, 08/07/2018, 07/31/2018, 07/09/2018 Cysto Fulgurate < 0.5 cm - 07/09/2018 Cysto Remove Stent FB Sim - 09/03/2017 Cystoscopy - 10/17/2018, 06/26/2018, 12/05/2017 Cystoscopy Insert Stent, Right - 08/23/2017 Cystoscopy TURBT <2 cm - 07/09/2018 Cystoscopy TURBT 2-5 cm - 08/23/2017 Locm 300-399Mg /Ml Iodine,1Ml - 08/07/2017 Vasectomy - 1982       PSH Notes: Jaw Surgery   NON-GU PSH: None   GU PMH: Bladder Cancer overlapping sites, He has multifocal Ta  tumors and is intermediate risk. He is going to need BCG induction and 1 year of maintenance. I have reviewed the side effects and risks of the BCG treatment. - 07/17/2018 Bladder Cancer Dome, He has multiple small recurrent tumors. I will get him set up for cystoscopy, bil RTG's, TURBT and instillation of gemcitabine. I reviewed the risks of bleeding, infection, bladder injury, need for a stent or secondary procedures, chemical cystitis, thrombotic events and anesthetic complications. - 06/26/2018 Bladder Cancer Lateral - 06/26/2018 History of bladder cancer, He has no recurrent disease. I will have him return in 6 months for cystoscopy. - 12/05/2017 Bladder Cancer Trigone , He had low grade superficial disease. I will have him return in 6 weeks for a renal US and in 3 months for cystoscopy. - 09/03/2017, He has an 28mm right trigonal lesion that is worrisome for UCCa. I am going to get him set up for cystoscopy with TURBT, possible right stent and possible epirubicin instillation. I reviewed the risks of bleeding, infection, bladder injury, need for a stent and secondary procedures, chemical cystitis, thrombotic events and anesthetic complications. , - 08/09/2017 Microscopic hematuria (Stable), I will get culture today. - 09/03/2017, He has significant microhematuria and needs eval with CT and possible cystoscopy. , - 07/30/2017 Renal calculus - 08/09/2017 BPH w/LUTS, Benign prostatic hyperplasia with urinary obstruction - 2016 Dysuria, Dysuria - 2016 Nocturia, Nocturia - 2016 Unil Inguinal Hernia W/O obst  or gang,non-recurrent, Inguinal hernia, right - 08-13-2014 Urinary Retention, Unspec, Urinary retention - 12-Aug-2013 Gross hematuria, Gross hematuria - 12-Aug-2013    NON-GU PMH: Solitary pulmonary nodule, He will need a Chest CT for further evaluation. I will discuss that with him at his post op visit. - 08/09/2017 Encounter for general adult medical examination without abnormal findings, Encounter for preventive health  examination - 13-Aug-2014 Personal history of other diseases of the circulatory system, History of hypertension - 08-12-2013 Personal history of other endocrine, nutritional and metabolic disease, History of hypercholesterolemia - 12-Aug-2013    FAMILY HISTORY: Death of family member - Runs In Family   SOCIAL HISTORY: Marital Status: Married Preferred Language: English; Race: White Current Smoking Status: Patient has never smoked.   Tobacco Use Assessment Completed: Used Tobacco in last 30 days? Drinks 3 caffeinated drinks per day.     Notes: Retired, Divorced, No alcohol use, Never a smoker, Daily caffeine consumption, 2-3 servings a day, One child   REVIEW OF SYSTEMS:    GU Review Male:   Patient denies frequent urination, hard to postpone urination, burning/ pain with urination, get up at night to urinate, leakage of urine, stream starts and stops, trouble starting your stream, have to strain to urinate , erection problems, and penile pain.  Gastrointestinal (Upper):   Patient denies vomiting, indigestion/ heartburn, and nausea.  Gastrointestinal (Lower):   Patient denies diarrhea and constipation.  Constitutional:   Patient denies fever, night sweats, weight loss, and fatigue.  Skin:   Patient denies skin rash/ lesion and itching.  Eyes:   Patient denies blurred vision and double vision.  Ears/ Nose/ Throat:   Patient denies sore throat and sinus problems.  Hematologic/Lymphatic:   Patient denies swollen glands and easy bruising.  Cardiovascular:   Patient denies leg swelling and chest pains.  Respiratory:   Patient denies cough and shortness of breath.  Endocrine:   Patient denies excessive thirst.  Musculoskeletal:   Patient denies back pain and joint pain.  Neurological:   Patient denies headaches and dizziness.  Psychologic:   Patient denies depression and anxiety.   VITAL SIGNS:      01/15/2019 01:45 PM  Weight 182 lb / 82.55 kg  Height 66 in / 167.64 cm  BP 135/80 mmHg  Pulse 76 /min   Temperature 98.4 F / 36.8 C  BMI 29.4 kg/m   MULTI-SYSTEM PHYSICAL EXAMINATION:    Constitutional: Well-nourished. No physical deformities. Normally developed. Good grooming.  Respiratory: Normal breath sounds. No labored breathing, no use of accessory muscles.   Cardiovascular: Regular rate and rhythm. No murmur, no gallop, no swelling, no varicosities.      PAST DATA REVIEWED:  Source Of History:  Patient  Records Review:   AUA Symptom Score  Urine Test Review:   Urinalysis   09/24/14  PSA  Total PSA 2.16     PROCEDURES:         Flexible Cystoscopy - 52000  Risks, benefits, and some of the potential complications of the procedure were discussed. 65ml of 2% lidocaine jelly was instilled intraurethrally.  Cipro 500mg  given for antibiotic prophylaxis.     Meatus:  Normal size. Normal location. Normal condition.  Urethra:  No strictures.  External Sphincter:  Normal.  Verumontanum:  Normal.  Prostate:  Obstructing. Enlarged median lobe. Moderate hyperplasia.  Bladder Neck:  Non-obstructing.  Ureteral Orifices:  Normal location. Normal size. Normal shape. Effluxed clear urine.  Bladder:  Moderate trabeculation. A dome tumor. 1/2 cm tumor with  a small posterior cellule. Normal mucosa. No stones.      The procedure was well tolerated and there were no complications.         Urinalysis w/Scope Dipstick Dipstick Cont'd Micro  Color: Yellow Bilirubin: Neg mg/dL WBC/hpf: NS (Not Seen)  Appearance: Clear Ketones: Neg mg/dL RBC/hpf: NS (Not Seen)  Specific Gravity: 1.020 Blood: Neg ery/uL Bacteria: NS (Not Seen)  pH: 6.5 Protein: 1+ mg/dL Cystals: NS (Not Seen)  Glucose: Neg mg/dL Urobilinogen: 0.2 mg/dL Casts: NS (Not Seen)    Nitrites: Neg Trichomonas: Not Present    Leukocyte Esterase: Neg leu/uL Mucous: Not Present      Epithelial Cells: 0 - 5/hpf      Yeast: NS (Not Seen)      Sperm: Not Present    ASSESSMENT:      ICD-10 Details  1 GU:   Bladder Cancer Dome - C67.1  He has a small recurrent tumor on the dome that appears low grade and superficial. He will need cystoscopy with bilateral retrogrades, biopsy and fulguration and instillation of Gemcitabine. Risks reviewed.   2   BPH w/o LUTS - N40.0 He has trilobar hyperplasia with obstruction but no LUTS.    PLAN:           Schedule Return Visit/Planned Activity: Next Available Appointment - Schedule Surgery  Procedure: Unspecified Date - Cystoscopy TURBT <2 cm - [id number]

## 2019-01-30 ENCOUNTER — Encounter (HOSPITAL_BASED_OUTPATIENT_CLINIC_OR_DEPARTMENT_OTHER): Payer: Self-pay | Admitting: *Deleted

## 2019-01-30 ENCOUNTER — Encounter (HOSPITAL_BASED_OUTPATIENT_CLINIC_OR_DEPARTMENT_OTHER)
Admission: RE | Disposition: A | Discharge status: Home / Self Care | Payer: Self-pay | Source: Home / Self Care | Attending: Urology

## 2019-01-30 ENCOUNTER — Ambulatory Visit (HOSPITAL_BASED_OUTPATIENT_CLINIC_OR_DEPARTMENT_OTHER): Payer: Medicare Other | Admitting: Anesthesiology

## 2019-01-30 ENCOUNTER — Other Ambulatory Visit: Payer: Self-pay

## 2019-01-30 ENCOUNTER — Ambulatory Visit (HOSPITAL_BASED_OUTPATIENT_CLINIC_OR_DEPARTMENT_OTHER)
Admission: RE | Admit: 2019-01-30 | Discharge: 2019-01-30 | Disposition: A | Discharge status: Home / Self Care | Payer: Medicare Other | Attending: Urology | Admitting: Urology

## 2019-01-30 DIAGNOSIS — C671 Malignant neoplasm of dome of bladder: Secondary | ICD-10-CM | POA: Insufficient documentation

## 2019-01-30 DIAGNOSIS — C679 Malignant neoplasm of bladder, unspecified: Secondary | ICD-10-CM | POA: Diagnosis not present

## 2019-01-30 DIAGNOSIS — E7800 Pure hypercholesterolemia, unspecified: Secondary | ICD-10-CM | POA: Insufficient documentation

## 2019-01-30 DIAGNOSIS — Z79899 Other long term (current) drug therapy: Secondary | ICD-10-CM | POA: Diagnosis not present

## 2019-01-30 DIAGNOSIS — D494 Neoplasm of unspecified behavior of bladder: Secondary | ICD-10-CM | POA: Diagnosis not present

## 2019-01-30 DIAGNOSIS — I10 Essential (primary) hypertension: Secondary | ICD-10-CM | POA: Diagnosis not present

## 2019-01-30 HISTORY — PX: CYSTOSCOPY WITH BIOPSY: SHX5122

## 2019-01-30 LAB — POCT I-STAT, CHEM 8
BUN: 24 mg/dL — ABNORMAL HIGH (ref 8–23)
Calcium, Ion: 1.19 mmol/L (ref 1.15–1.40)
Chloride: 104 mmol/L (ref 98–111)
Creatinine, Ser: 0.9 mg/dL (ref 0.61–1.24)
Glucose, Bld: 123 mg/dL — ABNORMAL HIGH (ref 70–99)
HCT: 42 % (ref 39.0–52.0)
Hemoglobin: 14.3 g/dL (ref 13.0–17.0)
Potassium: 4.5 mmol/L (ref 3.5–5.1)
Sodium: 139 mmol/L (ref 135–145)
TCO2: 23 mmol/L (ref 22–32)

## 2019-01-30 SURGERY — CYSTOSCOPY, WITH BIOPSY
Anesthesia: General | Laterality: Bilateral

## 2019-01-30 MED ORDER — GEMCITABINE CHEMO FOR BLADDER INSTILLATION 2000 MG
2000.0000 mg | Freq: Once | INTRAVENOUS | Status: AC
  Administered 2019-01-30: 09:00:00 2000 mg via INTRAVESICAL
  Filled 2019-01-30: qty 2000

## 2019-01-30 MED ORDER — FENTANYL CITRATE (PF) 100 MCG/2ML IJ SOLN
INTRAMUSCULAR | Status: AC
  Filled 2019-01-30: qty 2

## 2019-01-30 MED ORDER — CEFAZOLIN SODIUM-DEXTROSE 2-4 GM/100ML-% IV SOLN
INTRAVENOUS | Status: AC
  Filled 2019-01-30: qty 100

## 2019-01-30 MED ORDER — LIDOCAINE 2% (20 MG/ML) 5 ML SYRINGE
INTRAMUSCULAR | Status: DC | PRN
  Administered 2019-01-30: 60 mg via INTRAVENOUS

## 2019-01-30 MED ORDER — LACTATED RINGERS IV SOLN
INTRAVENOUS | Status: DC
  Administered 2019-01-30 (×2): via INTRAVENOUS
  Filled 2019-01-30: qty 1000

## 2019-01-30 MED ORDER — FENTANYL CITRATE (PF) 100 MCG/2ML IJ SOLN
25.0000 ug | INTRAMUSCULAR | Status: DC | PRN
  Filled 2019-01-30: qty 1

## 2019-01-30 MED ORDER — CEFAZOLIN SODIUM-DEXTROSE 2-4 GM/100ML-% IV SOLN
2.0000 g | INTRAVENOUS | Status: AC
  Administered 2019-01-30: 08:00:00 2 g via INTRAVENOUS
  Filled 2019-01-30: qty 100

## 2019-01-30 MED ORDER — FENTANYL CITRATE (PF) 100 MCG/2ML IJ SOLN
INTRAMUSCULAR | Status: DC | PRN
  Administered 2019-01-30: 25 ug via INTRAVENOUS
  Administered 2019-01-30: 50 ug via INTRAVENOUS

## 2019-01-30 MED ORDER — ONDANSETRON HCL 4 MG/2ML IJ SOLN
INTRAMUSCULAR | Status: DC | PRN
  Administered 2019-01-30: 4 mg via INTRAVENOUS

## 2019-01-30 MED ORDER — STERILE WATER FOR IRRIGATION IR SOLN
Status: DC | PRN
  Administered 2019-01-30: 6000 mL

## 2019-01-30 MED ORDER — PROPOFOL 10 MG/ML IV BOLUS
INTRAVENOUS | Status: DC | PRN
  Administered 2019-01-30: 100 mg via INTRAVENOUS

## 2019-01-30 MED ORDER — DEXAMETHASONE SODIUM PHOSPHATE 10 MG/ML IJ SOLN
INTRAMUSCULAR | Status: DC | PRN
  Administered 2019-01-30: 5 mg via INTRAVENOUS

## 2019-01-30 MED ORDER — ONDANSETRON HCL 4 MG/2ML IJ SOLN
4.0000 mg | Freq: Once | INTRAMUSCULAR | Status: DC | PRN
  Filled 2019-01-30: qty 2

## 2019-01-30 MED ORDER — IOHEXOL 300 MG/ML  SOLN
INTRAMUSCULAR | Status: DC | PRN
  Administered 2019-01-30: 09:00:00 20 mL via URETHRAL

## 2019-01-30 MED ORDER — LIDOCAINE 2% (20 MG/ML) 5 ML SYRINGE
INTRAMUSCULAR | Status: AC
  Filled 2019-01-30: qty 5

## 2019-01-30 SURGICAL SUPPLY — 26 items
BAG DRAIN URO-CYSTO SKYTR STRL (DRAIN) ×3 IMPLANT
BAG URINE DRAINAGE (UROLOGICAL SUPPLIES) IMPLANT
BAG URINE LEG 500ML (DRAIN) IMPLANT
CATH FOLEY 2WAY SLVR  5CC 18FR (CATHETERS) ×2
CATH FOLEY 2WAY SLVR  5CC 20FR (CATHETERS)
CATH FOLEY 2WAY SLVR  5CC 22FR (CATHETERS)
CATH FOLEY 2WAY SLVR 5CC 18FR (CATHETERS) ×1 IMPLANT
CATH FOLEY 2WAY SLVR 5CC 20FR (CATHETERS) IMPLANT
CATH FOLEY 2WAY SLVR 5CC 22FR (CATHETERS) IMPLANT
CLOTH BEACON ORANGE TIMEOUT ST (SAFETY) ×3 IMPLANT
ELECT REM PT RETURN 9FT ADLT (ELECTROSURGICAL) ×3
ELECTRODE REM PT RTRN 9FT ADLT (ELECTROSURGICAL) ×1 IMPLANT
GLOVE SURG SS PI 8.0 STRL IVOR (GLOVE) ×3 IMPLANT
GLOVE SURG SS PI 8.5 STRL IVOR (GLOVE) ×2
GLOVE SURG SS PI 8.5 STRL STRW (GLOVE) ×1 IMPLANT
GOWN STRL REUS W/TWL XL LVL3 (GOWN DISPOSABLE) ×6 IMPLANT
HOLDER FOLEY CATH W/STRAP (MISCELLANEOUS) IMPLANT
IV NS IRRIG 3000ML ARTHROMATIC (IV SOLUTION) IMPLANT
KIT TURNOVER CYSTO (KITS) ×3 IMPLANT
MANIFOLD NEPTUNE II (INSTRUMENTS) ×3 IMPLANT
PACK CYSTO (CUSTOM PROCEDURE TRAY) ×3 IMPLANT
PLUG CATH AND CAP STER (CATHETERS) IMPLANT
SYRINGE IRR TOOMEY STRL 70CC (SYRINGE) IMPLANT
TUBE CONNECTING 12'X1/4 (SUCTIONS) ×1
TUBE CONNECTING 12X1/4 (SUCTIONS) ×2 IMPLANT
WATER STERILE IRR 3000ML UROMA (IV SOLUTION) ×6 IMPLANT

## 2019-01-30 NOTE — Anesthesia Postprocedure Evaluation (Signed)
Anesthesia Post Note  Patient: JADEL WYSONG  Procedure(s) Performed: CYSTOSCOPY BILATERAL RETROGRADES  WITH BIOPSY AND FULGURATION BLADDER TUMOR INSTILL GEMCITABINE (Bilateral )     Patient location during evaluation: PACU Anesthesia Type: General Level of consciousness: awake and alert Pain management: pain level controlled Vital Signs Assessment: post-procedure vital signs reviewed and stable Respiratory status: spontaneous breathing, nonlabored ventilation, respiratory function stable and patient connected to nasal cannula oxygen Cardiovascular status: blood pressure returned to baseline and stable Postop Assessment: no apparent nausea or vomiting Anesthetic complications: no    Last Vitals:  Vitals:   01/30/19 0719 01/30/19 0905  BP: 136/85 (!) 159/93  Pulse: (!) 58 62  Resp:  16  Temp: (!) 36.4 C 37.1 C  SpO2: 97%     Last Pain:  Vitals:   01/30/19 0905  TempSrc:   PainSc: 0-No pain                 Pervis Hocking

## 2019-01-30 NOTE — Transfer of Care (Signed)
Immediate Anesthesia Transfer of Care Note  Patient: TEODORO LOTA  Procedure(s) Performed: Procedure(s) (LRB): CYSTOSCOPY BILATERAL RETROGRADES  WITH BIOPSY AND FULGURATION BLADDER TUMOR INSTILL GEMCITABINE (Bilateral)  Patient Location: PACU  Anesthesia Type: General  Level of Consciousness: awake, oriented, sedated and patient cooperative  Airway & Oxygen Therapy: Patient Spontanous Breathing and Patient connected to face mask oxygen  Post-op Assessment: Report given to PACU RN and Post -op Vital signs reviewed and stable  Post vital signs: Reviewed and stable  Complications: No apparent anesthesia complications Last Vitals:  Vitals Value Taken Time  BP 159/93 01/30/19 0907  Temp 37.1 C 01/30/19 0905  Pulse 71 01/30/19 0909  Resp 21 01/30/19 0909  SpO2 100 % 01/30/19 0909  Vitals shown include unvalidated device data.  Last Pain:  Vitals:   01/30/19 0905  TempSrc:   PainSc: (P) 0-No pain      Patients Stated Pain Goal: 5 (01/30/19 0719)

## 2019-01-30 NOTE — Interval H&P Note (Signed)
There is no change in the patient's history.

## 2019-01-30 NOTE — Discharge Instructions (Addendum)
°  Post Anesthesia Home Care Instructions  Activity: Get plenty of rest for the remainder of the day. A responsible individual must stay with you for 24 hours following the procedure.  For the next 24 hours, DO NOT: -Drive a car -Paediatric nurse -Drink alcoholic beverages -Take any medication unless instructed by your physician -Make any legal decisions or sign important papers.  Meals: Start with liquid foods such as gelatin or soup. Progress to regular foods as tolerated. Avoid greasy, spicy, heavy foods. If nausea and/or vomiting occur, drink only clear liquids until the nausea and/or vomiting subsides. Call your physician if vomiting continues.  Special Instructions/Symptoms: Your throat may feel dry or sore from the anesthesia or the breathing tube placed in your throat during surgery. If this causes discomfort, gargle with warm salt water. The discomfort should disappear within 24 hours.  If you had a scopolamine patch placed behind your ear for the management of post- operative nausea and/or vomiting:  1. The medication in the patch is effective for 72 hours, after which it should be removed.  Wrap patch in a tissue and discard in the trash. Wash hands thoroughly with soap and water. 2. You may remove the patch earlier than 72 hours if you experience unpleasant side effects which may include dry mouth, dizziness or visual disturbances. 3. Avoid touching the patch. Wash your hands with soap and water after contact with the patch.    CYSTOSCOPY HOME CARE INSTRUCTIONS  Activity: Rest for the remainder of the day.  Do not drive or operate equipment today.  You may resume normal activities in one to two days as instructed by your physician.   Meals: Drink plenty of liquids and eat light foods such as gelatin or soup this evening.  You may return to a normal meal plan tomorrow.  Return to Work: You may return to work in one to two days or as instructed by your physician.  Special  Instructions / Symptoms: Call your physician if any of these symptoms occur:   -persistent or heavy bleeding  -bleeding which continues after first few urination  -large blood clots that are difficult to pass  -urine stream diminishes or stops completely  -fever equal to or higher than 101 degrees Farenheit.  -cloudy urine with a strong, foul odor  -severe pain  Females should always wipe from front to back after elimination.  You may feel some burning pain when you urinate.  This should disappear with time.  Applying moist heat to the lower abdomen or a hot tub bath may help relieve the pain. \    Patient Signature:  ________________________________________________________  Nurse's Signature:  ________________________________________________________

## 2019-01-30 NOTE — Op Note (Signed)
Procedure: 1.  Cystoscopy with biopsy and fulguration of 1 cm bladder dome tumor. 2.  Cystoscopy with bilateral retrograde pyelograms and interpretation. 3.  Instillation of gemcitabine in PACU.  Preop diagnosis: Recurrent bladder cancer.  Postop diagnosis: Same.  Surgeon: Dr. [name].  Anesthesia: General.  Specimen: Biopsies from dome and anterior bladder neck.  EBL: 1 to 2 mL.  Complications: None.  Indications: The patient is an 83 year old white male with a history of low-grade superficial urothelial neoplasms who was found on recent surveillance cystoscopy to have a small recurrence on the dome of the bladder.  It was felt that biopsy resection of the lesion with instillation of gemcitabine  and upper tract evaluations with retrograde pyelography was indicated.  Procedure: He was taken operating room where antibiotic was given.  A general anesthetic was induced.  He was placed in lithotomy position and fitted with PAS hose.  His perineum and genitalia were prepped with Betadine solution he was draped in usual sterile fashion.  Cystoscopy was performed using a 23 [place] scope and 30 degree lens.  Examination revealed a normal urethra.  The external sphincter was intact.  The prostatic urethra was approximately 2 to 3 cm in length with bilobar hyperplasia and obstruction.  Examination of bladder revealed mild to moderate trabeculation with a few cellules on the posterior wall.  There were 2 small tumors adjacent to one another in an area approximately 1 cm on the dome of the bladder.  Closer to the bladder neck was there some slightly irregular mucosa as well.  The left ureteral orifice was unremarkable.  The left ureteral orifice had previously been resected and was lateral with surrounding scar but patent.  A cup biopsy forceps was used to remove the tumor from the dome.  An additional biopsy was obtained from the anterior bladder neck.  The biopsy sites were then generously  fulgurated with the Bugbee electrode.  Once hemostasis was achieved bilateral retrograde pyelograms were performed using a 5 French opening catheter and Omnipaque.  The right retrograde pyelogram demonstrated a normal ureter and intrarenal collecting system.  The left retrograde pyelogram demonstrated a normal ureter and intrarenal collecting system.  Final inspection demonstrated no active bleeding or significant bladder wall perforation.  The bladder was drained and the cystoscope was removed.  An 45 French Foley catheter was inserted and the balloon was filled with 10 mL of sterile fluid.  The catheter was irrigated with clear return and placed to straight drainage.  He was taken down from lithotomy position, his anesthetic was reversed and he was moved to recovery in stable condition.  In the recovery room his bladder was instilled with 2000 mg of gemcitabine and 50 mL of diluent.  The chemotherapy was left indwelling for 1 hour and then the bladder was drained and the catheter was removed.  There were no complications.

## 2019-01-30 NOTE — Anesthesia Preprocedure Evaluation (Addendum)
Anesthesia Evaluation  Patient identified by MRN, date of birth, ID band Patient awake    Reviewed: Allergy & Precautions, H&P , NPO status , Patient's Chart, lab work & pertinent test results, reviewed documented beta blocker date and time   Airway Mallampati: I  TM Distance: >3 FB Neck ROM: full    Dental no notable dental hx. (+) Partial Lower, Missing, Caps, Dental Advisory Given,    Pulmonary neg pulmonary ROS,    Pulmonary exam normal breath sounds clear to auscultation       Cardiovascular Exercise Tolerance: Good hypertension, Pt. on medications negative cardio ROS   Rhythm:regular Rate:Normal     Neuro/Psych negative neurological ROS  negative psych ROS   GI/Hepatic Neg liver ROS, hiatal hernia,   Endo/Other  negative endocrine ROS  Renal/GU negative Renal ROS Bladder dysfunction  Hx bladder tumor s/p TURBT 07/2017, urinary retention    Musculoskeletal negative musculoskeletal ROS (+)   Abdominal Normal abdominal exam  (+) + obese,   Peds  Hematology negative hematology ROS (+)   Anesthesia Other Findings   Reproductive/Obstetrics negative OB ROS                            Anesthesia Physical  Anesthesia Plan  ASA: II  Anesthesia Plan: General   Post-op Pain Management:    Induction: Intravenous  PONV Risk Score and Plan: 2 and Ondansetron and Treatment may vary due to age or medical condition  Airway Management Planned: LMA  Additional Equipment: None  Intra-op Plan:   Post-operative Plan: Extubation in OR  Informed Consent: I have reviewed the patients History and Physical, chart, labs and discussed the procedure including the risks, benefits and alternatives for the proposed anesthesia with the patient or authorized representative who has indicated his/her understanding and acceptance.     Dental Advisory Given  Plan Discussed with: CRNA, Surgeon and  Anesthesiologist  Anesthesia Plan Comments: ( )        Anesthesia Quick Evaluation

## 2019-01-30 NOTE — Anesthesia Procedure Notes (Signed)
Procedure Name: LMA Insertion Date/Time: 01/30/2019 8:32 AM Performed by: Suan Halter, CRNA Pre-anesthesia Checklist: Patient identified, Emergency Drugs available, Suction available and Patient being monitored Patient Re-evaluated:Patient Re-evaluated prior to induction Oxygen Delivery Method: Circle system utilized Preoxygenation: Pre-oxygenation with 100% oxygen Induction Type: IV induction Ventilation: Mask ventilation without difficulty LMA: LMA inserted LMA Size: 4.0 Number of attempts: 1 Airway Equipment and Method: Bite block Placement Confirmation: positive ETCO2 Tube secured with: Tape Dental Injury: Teeth and Oropharynx as per pre-operative assessment

## 2019-01-31 ENCOUNTER — Encounter (HOSPITAL_BASED_OUTPATIENT_CLINIC_OR_DEPARTMENT_OTHER): Payer: Self-pay | Admitting: Urology

## 2019-01-31 LAB — SURGICAL PATHOLOGY

## 2019-02-10 DIAGNOSIS — C671 Malignant neoplasm of dome of bladder: Secondary | ICD-10-CM | POA: Diagnosis not present

## 2019-02-26 DIAGNOSIS — C678 Malignant neoplasm of overlapping sites of bladder: Secondary | ICD-10-CM | POA: Diagnosis not present

## 2019-02-26 DIAGNOSIS — Z5111 Encounter for antineoplastic chemotherapy: Secondary | ICD-10-CM | POA: Diagnosis not present

## 2019-03-05 DIAGNOSIS — C678 Malignant neoplasm of overlapping sites of bladder: Secondary | ICD-10-CM | POA: Diagnosis not present

## 2019-03-05 DIAGNOSIS — Z5111 Encounter for antineoplastic chemotherapy: Secondary | ICD-10-CM | POA: Diagnosis not present

## 2019-03-12 DIAGNOSIS — C678 Malignant neoplasm of overlapping sites of bladder: Secondary | ICD-10-CM | POA: Diagnosis not present

## 2019-03-12 DIAGNOSIS — Z5111 Encounter for antineoplastic chemotherapy: Secondary | ICD-10-CM | POA: Diagnosis not present

## 2019-03-19 DIAGNOSIS — Z5111 Encounter for antineoplastic chemotherapy: Secondary | ICD-10-CM | POA: Diagnosis not present

## 2019-03-19 DIAGNOSIS — C678 Malignant neoplasm of overlapping sites of bladder: Secondary | ICD-10-CM | POA: Diagnosis not present

## 2019-03-26 DIAGNOSIS — Z5111 Encounter for antineoplastic chemotherapy: Secondary | ICD-10-CM | POA: Diagnosis not present

## 2019-03-26 DIAGNOSIS — R3121 Asymptomatic microscopic hematuria: Secondary | ICD-10-CM | POA: Diagnosis not present

## 2019-03-26 DIAGNOSIS — C678 Malignant neoplasm of overlapping sites of bladder: Secondary | ICD-10-CM | POA: Diagnosis not present

## 2019-04-02 DIAGNOSIS — Z5111 Encounter for antineoplastic chemotherapy: Secondary | ICD-10-CM | POA: Diagnosis not present

## 2019-04-02 DIAGNOSIS — C678 Malignant neoplasm of overlapping sites of bladder: Secondary | ICD-10-CM | POA: Diagnosis not present

## 2019-04-19 ENCOUNTER — Ambulatory Visit (HOSPITAL_COMMUNITY)
Admission: EM | Admit: 2019-04-19 | Discharge: 2019-04-19 | Disposition: A | Discharge status: Home / Self Care | Payer: Medicare Other | Source: Home / Self Care

## 2019-04-19 ENCOUNTER — Emergency Department (HOSPITAL_COMMUNITY)
Admission: EM | Admit: 2019-04-19 | Discharge: 2019-04-19 | Disposition: A | Discharge status: Home / Self Care | Payer: Medicare Other | Attending: Emergency Medicine | Admitting: Emergency Medicine

## 2019-04-19 ENCOUNTER — Encounter (HOSPITAL_COMMUNITY): Payer: Self-pay | Admitting: Emergency Medicine

## 2019-04-19 ENCOUNTER — Other Ambulatory Visit: Payer: Self-pay

## 2019-04-19 ENCOUNTER — Emergency Department (HOSPITAL_COMMUNITY): Payer: Medicare Other

## 2019-04-19 ENCOUNTER — Ambulatory Visit (INDEPENDENT_AMBULATORY_CARE_PROVIDER_SITE_OTHER): Payer: Medicare Other

## 2019-04-19 DIAGNOSIS — S0031XA Abrasion of nose, initial encounter: Secondary | ICD-10-CM | POA: Insufficient documentation

## 2019-04-19 DIAGNOSIS — I10 Essential (primary) hypertension: Secondary | ICD-10-CM | POA: Diagnosis not present

## 2019-04-19 DIAGNOSIS — Y999 Unspecified external cause status: Secondary | ICD-10-CM | POA: Insufficient documentation

## 2019-04-19 DIAGNOSIS — S0990XA Unspecified injury of head, initial encounter: Secondary | ICD-10-CM

## 2019-04-19 DIAGNOSIS — Z23 Encounter for immunization: Secondary | ICD-10-CM | POA: Diagnosis not present

## 2019-04-19 DIAGNOSIS — Y92481 Parking lot as the place of occurrence of the external cause: Secondary | ICD-10-CM | POA: Insufficient documentation

## 2019-04-19 DIAGNOSIS — W101XXA Fall (on)(from) sidewalk curb, initial encounter: Secondary | ICD-10-CM | POA: Diagnosis not present

## 2019-04-19 DIAGNOSIS — W19XXXA Unspecified fall, initial encounter: Secondary | ICD-10-CM

## 2019-04-19 DIAGNOSIS — S62615A Displaced fracture of proximal phalanx of left ring finger, initial encounter for closed fracture: Secondary | ICD-10-CM

## 2019-04-19 DIAGNOSIS — Y9301 Activity, walking, marching and hiking: Secondary | ICD-10-CM | POA: Diagnosis not present

## 2019-04-19 MED ORDER — TETANUS-DIPHTH-ACELL PERTUSSIS 5-2.5-18.5 LF-MCG/0.5 IM SUSP
0.5000 mL | Freq: Once | INTRAMUSCULAR | Status: AC
  Administered 2019-04-19: 13:00:00 0.5 mL via INTRAMUSCULAR
  Filled 2019-04-19: qty 0.5

## 2019-04-19 NOTE — Discharge Instructions (Signed)
You were seen in the ER after a fall  You have a fracture at the base of your left ring finger.  I have spoken with hand surgeon Dr. Fredna Dow who recommends a splint and follow-up in the office to determine if you will need surgery.  Call his office on Monday to make an appointment.  Elevate your hand.  Alternate ibuprofen or Tylenol as needed for pain.  Return to the ER if there is sudden severe or worsening hand pain, coolness or purple discoloration to your fingertip, loss of sensation or tingling in your hand or finger.  We discussed there is a very low chance that you have suffered a severe or significant facial head or brain injury or bleed.  We discussed need for head CT to rule this out and ultimately you are comfortable declining this today.  I think this is reasonable as you are fall was very low risk and you have no symptoms to point to a significant injury.  Monitor and return for severe or new headaches, vision changes, double vision, neck pain, stroke symptoms like difficulty with speech, slurred speech, difficulty verbalizing words, one-sided numbness weakness or drooping to your face or extremities, difficulty with balance or walking.

## 2019-04-19 NOTE — ED Provider Notes (Signed)
[place] EMERGENCY DEPARTMENT Provider Note   CSN: [id number] Arrival date & time: 04/19/19  1130     History Chief Complaint  Patient presents with  . Fall    Zachary Ingram is a 83 y.o. male presents to the ER from urgent care for further evaluation of possible facial and head injury.  Patient fell yesterday.  Patient was walking into the mall to get a pedicure.  States he was rushing and tripped over the curb and fell bracing his fall on the left hand.  He noticed sudden pain in his left ring finger.   Was able to get up and go in the salon to get his pedicure.  Noticed a small area of tenderness on the bridge of his nose with little bit of blood.  He then went to [facility] to get his glasses readjusted but states they were not broken.  He does not remember any head or facial injury and is unsure how his glasses got bent.  Has been feeling well otherwise with mild pain in his left ring finger.  He called his grandchildren this morning who were concerned about the appearance of his hand and recommended he come to the ER.  Was seen in urgent care earlier today and diagnosed with a broken left ring finger.  Given his possible facial injury there was concern about intracranial injury and bleeding from possible facial injury, was sent to ER for further evaluation.  Patient denies any facial or head pain, headaches, vision changes, double vision, nausea, vomiting, seizures, neck pain or any other stroke symptoms.  Takes aspirin but no anticoagulants.  Denies any pain or swelling on head. No interventions. No modifying factors.   HPI     Past Medical History:  Diagnosis Date  . Bladder tumor   . History of hiatal hernia   . History of urinary retention 2015  . Hyperlipidemia   . Hypertension   . Wears hearing aid in both ears     There are no problems to display for this patient.   Past Surgical History:  Procedure Laterality Date  . CATARACT EXTRACTION W/  INTRAOCULAR LENS  IMPLANT, BILATERAL Bilateral 11/2016  . COLONOSCOPY  2016  approx.  . CYSTOSCOPY W/ URETERAL STENT PLACEMENT Right 08/23/2017   Procedure: CYSTOSCOPY WITH  RIGHT RETROGRADE AND RIGHT  STENT PLACEMENT;  Surgeon: [name], MD;  Location: [facility];  Service: Urology;  Laterality: Right;  . CYSTOSCOPY WITH BIOPSY Bilateral 01/30/2019   Procedure: CYSTOSCOPY BILATERAL RETROGRADES  WITH BIOPSY AND FULGURATION BLADDER TUMOR INSTILL GEMCITABINE;  Surgeon: [name], MD;  Location: [facility];  Service: Urology;  Laterality: Bilateral;  . HIATAL HERNIA REPAIR  1980s   open  . TRANSURETHRAL RESECTION OF BLADDER TUMOR WITH MITOMYCIN-C N/A 08/23/2017   Procedure: TRANSURETHRAL RESECTION OF BLADDER TUMOR WITH POSSIBLE EPIRUBACIN;  Surgeon: [name], MD;  Location: [facility];  Service: Urology;  Laterality: N/A;  . TRANSURETHRAL RESECTION OF BLADDER TUMOR WITH MITOMYCIN-C Bilateral 07/09/2018   Procedure: CYSTOSCOPY  BILATERAL RETROGRADES TRANSURETHRAL RESECTION OF BLADDER TUMOR WITH GEMCITABINE INSTILLATION;  Surgeon: [name], MD;  Location: [facility];  Service: Urology;  Laterality: Bilateral;       No family history on file.  Social History   Tobacco Use  . Smoking status: Never Smoker  . Smokeless tobacco: Never Used  Substance Use Topics  . Alcohol use: No  . Drug use: No  Home Medications Prior to Admission medications   Medication Sig Start Date End Date Taking? Authorizing Provider  Ascorbic Acid (VITAMIN C PO) Take by mouth.    [provider]  aspirin EC 81 MG tablet Take 81 mg by mouth daily.    [provider]  lisinopril (PRINIVIL,ZESTRIL) 20 MG tablet Take 20 mg by mouth every morning.  08/31/13   [provider]  lovastatin (MEVACOR) 40 MG tablet Take 80 mg by mouth at bedtime.  10/20/13   [provider]  Multiple Vitamin (MULTIVITAMIN) tablet Take 1  tablet by mouth daily.    [provider]  Omega-3 Fatty Acids (FISH OIL) 1000 MG CAPS Take 2 capsules by mouth daily.    [provider]  VITAMIN D PO Take by mouth.    [provider]    Allergies    Patient has no known allergies.  Review of Systems   Review of Systems  Musculoskeletal: Positive for arthralgias and joint swelling.  Skin: Positive for color change and wound.  All other systems reviewed and are negative.   Physical Exam Updated Vital Signs BP 124/61   Pulse (!) 57   Temp 98.1 F (36.7 C) (Oral)   Resp (!) 27   Ht 5\' 6"  (1.676 m)   Wt 81.6 kg   SpO2 93%   BMI 29.05 kg/m   Physical Exam Vitals and nursing note reviewed.  Constitutional:      General: He is not in acute distress.    Appearance: He is well-developed.     Comments: NAD.  HENT:     Head: Normocephalic.     Comments: No scalp bone tenderness.  No signs of scalp trauma.    Right Ear: External ear normal.     Left Ear: External ear normal.     Ears:     Comments: TMs normal.  No periauricular bruising or battle signs.    Nose: Nose normal.     Comments: Small nontender abrasion on bridge of nose.  No focal bony tenderness to nasal bones, axillary sinuses.  Midface is stable.  Intranasal mucosa normal.  Septum midline.  No epistaxis.    Mouth/Throat:     Comments: Dentition stable.  No signs of intraoral or tongue injury.  No mandibular or maxillary tenderness. Eyes:     General: No scleral icterus.    Conjunctiva/sclera: Conjunctivae normal.     Comments: No periorbital bone tenderness.  Full EOMs without pain.  PERRL bilaterally.  No raccoons eyes.  Neck:     Comments: No midline or paraspinal muscle tenderness.  Full range of motion of the neck.  Trachea midline. Cardiovascular:     Rate and Rhythm: Normal rate and regular rhythm.     Heart sounds: Normal heart sounds.  Pulmonary:     Effort: Pulmonary effort is normal.     Breath sounds: Normal breath  sounds.  Musculoskeletal:        General: Tenderness and deformity present. Normal range of motion.     Cervical back: Normal range of motion and neck supple.     Comments:  Moderate edema of left 4th and 5th fingers with ecchymosis extending into ulnar aspect of dorsal hand.  TTP at 4th proximal phalanx and MCP. Decreased flexion of 4th MCP secondary to pain and swelling.  No focal bony tenderness along metacarpals, wrist bones. No scaphoid bone tenderness. Negative scaphoid compression test. Full ROM of wrist with pain. Non tender elbow with full  ROM.   Skin:    General: Skin is warm and dry.     Capillary Refill: Capillary refill takes less than 2 seconds.  Neurological:     Mental Status: He is alert and oriented to person, place, and time.     Comments:  Mental Status: Patient is awake, alert, oriented to person, place, year, and situation.  Patient is able to give a clear and coherent history. Speech is fluent and clear without dysarthria or aphasia. No signs of neglect.  Cranial Nerves: I not tested II visual fields full bilaterally. PERRL.  Unable to visualize posterior eye. III, IV, VI EOMs intact without ptosis or diplopia  V sensation to light touch intact in all 3 divisions of trigeminal nerve bilaterally  VII facial movements symmetric bilaterally VIII hearing intact to voice/conversation  IX, X no uvula deviation, symmetric rise of soft palate/uvula XI 5/5 SCM and trapezius strength bilaterally  XII tongue protrusion midline, symmetric L/R movements  Motor: Strength 5/5 in upper/lower extremities .   Sensation to light touch intact in face, upper/lower extremities. No pronator drift. No leg drop.  Cerebellar: No ataxia with finger to nose.  Steady gait.  Psychiatric:        Behavior: Behavior normal.        Thought Content: Thought content normal.        Judgment: Judgment normal.     ED Results / Procedures / Treatments   Labs (all labs ordered are listed,  but only abnormal results are displayed) Labs Reviewed - No data to display  EKG None  Radiology DG Hand Complete Left  Result Date: 04/19/2019 CLINICAL DATA:  Fall yesterday. Left hand swelling. Painful fourth finger. EXAM: LEFT HAND - COMPLETE 3+ VIEW COMPARISON:  None. FINDINGS: There is a fracture through the proximal aspect of the proximal fourth phalanx with significant displacement. No other acute abnormalities. IMPRESSION: Fracture through the base of the proximal fourth phalanx with significant displacement. Electronically Signed   By: Dorise Bullion III M.D   On: 04/19/2019 10:39   DG Finger Ring Left  Result Date: 04/19/2019 CLINICAL DATA:  Pain and swelling in the left hand. Proximal 4th phalangeal fracture. EXAM: LEFT RING FINGER 2+V COMPARISON:  Hand radiographs same date. FINDINGS: The bones are mildly demineralized. Oblique extra-articular fracture through the base of the 4th proximal phalanx remains mildly displaced in an ulnar and palmar direction. The alignment is unchanged from the previous study. There is no intra-articular extension of the fracture. Mild interphalangeal degenerative changes are present. IMPRESSION: Stable appearance of the extra-articular fracture of the 4th proximal phalanx. Electronically Signed   By: Richardean Sale M.D.   On: 04/19/2019 12:59    Procedures Procedures (including critical care time)  Medications Ordered in ED Medications  Tdap (BOOSTRIX) injection 0.5 mL (0.5 mLs Intramuscular Given 04/19/19 1311)    ED Course  I have reviewed the triage vital signs and the nursing notes.  Pertinent labs & imaging results that were available during my care of the patient were reviewed by me and considered in my medical decision making (see chart for details).    MDM Rules/Calculators/A&P                      EMR reviewed.  Urgent visit reviewed.  Physical exam is vastly reassuring.  Patient denies any red flag symptoms of significant head  or facial injury.  Thorough neurological exam today is benign.  No anticoagulation.  Given age, nasal  abrasion and urgent care referral, discussed CT scan and offered but explained my clinical suspicion for significant facial, head and intracranial injury such as bleed was very low based on exam, MOI.  He understands CT scan is only way to rule intracranial injury or bleed out but shared decision making discussion had with patient and he is comfortable deferring scan here and has declined CT scan today.  I think this is reasonable.   Discussed at length symptoms to monitor for at home.  He lives alone but grandchildren will visit him today.  Will update tetanus here.   X-rays reviewed show proximal 4th metacarpal fracture with significant displacement. Mild decreased ROM of this finger. Grossly neurovascularly intact. May need fixation. Will obtain dedicated x-rays, consult hand for OP follow up.   1330: X-rays personally reviewed show minimally displaced fracture of proximal phalanx.  Extremities neurovascularly intact.  Discussed with Dr. Fredna Dow who recommends splint ulnar versus volar and follow-up in the office.  Patient will be placed in volar splint.  Discussed disposition plan, symptomatic management.  Shared with EDP. Final Clinical Impression(s) / ED Diagnoses Final diagnoses:  Fall, initial encounter  Abrasion of nose, initial encounter  Closed displaced fracture of proximal phalanx of left ring finger, initial encounter    Rx / DC Orders ED Discharge Orders    None       Kinnie Feil, PA-C 04/19/19 1330    Pattricia Boss, MD 04/21/19 [phone]

## 2019-04-19 NOTE — ED Notes (Signed)
Patient is being discharged from the Urgent [facility] and sent to the Emergency Department via wheelchair by staff. Per Maylon Cos. pa, patient is stable but in need of higher level of care due to possible head injury. Patient is aware and verbalizes understanding of plan of care.  Vitals:   04/19/19 1025  BP: 124/79  Pulse: (!) 56  Resp: 18  Temp: 98.3 F (36.8 C)  SpO2: 96%

## 2019-04-19 NOTE — Discharge Instructions (Addendum)
Go to the Emergency Department to be further evaluated for your fall injury to your face/head.  You will receive care for your finger injury there as well.

## 2019-04-19 NOTE — Progress Notes (Signed)
Orthopedic Tech Progress Note Patient Details:  Zachary Ingram [birth year] [id number]  Ortho Devices Type of Ortho Device: Volar splint Ortho Device/Splint Location: LUE Ortho Device/Splint Interventions: Application, Ordered   Post Interventions Patient Tolerated: Well Instructions Provided: Care of device, Adjustment of device   Zachary Ingram 04/19/2019, 1:46 PM

## 2019-04-19 NOTE — ED Triage Notes (Addendum)
Patient fell while walking in cold, "got in a hurry..walking too fast"  Patient reports his left hand is his only painful area or concern.  Patient does have an abrasion to bridge of nose.  Left hand is red, swollen and bruising, ring finger being the most painful  Incident occurred yesterday

## 2019-04-19 NOTE — ED Provider Notes (Addendum)
[place]    CSN: [id number] Arrival date & time: 04/19/19  1004      History   Chief Complaint Chief Complaint  Patient presents with  . Fall    HPI Zachary Ingram is a 83 y.o. male.   Patient reports to urgent care today for fall and injury to left hand. Yesterday while walking in a hurry the patient tripped on a curb and fell forward with his left hand outstretched. He noticed pain in his left ring finger, a small abrasion to his nose and that his glasses were bent and nearly broken. He is not completely sure of how he hit his face/head and his glasses were broken. He denies loss of memory prior or after event and was able to continue with his day. He denies headache, nausea, vomiting, visual changes. He denies other recent falls and notes he has fallen before while running but this was some time ago. He denies pain other than his left hand.   In clinic today he is not in much pain and reports to be feeling well but wanted to be evaluated. He has not taken any medications specific for his pain.  He takes aspirin but denies medications outside of what is listed in his chart.   We attempted to contact with witness of fall but were unable to do so.           Past Medical History:  Diagnosis Date  . Bladder tumor   . History of hiatal hernia   . History of urinary retention 2015  . Hyperlipidemia   . Hypertension   . Wears hearing aid in both ears     There are no problems to display for this patient.   Past Surgical History:  Procedure Laterality Date  . CATARACT EXTRACTION W/ INTRAOCULAR LENS  IMPLANT, BILATERAL Bilateral 11/2016  . COLONOSCOPY  2016  approx.  . CYSTOSCOPY W/ URETERAL STENT PLACEMENT Right 08/23/2017   Procedure: CYSTOSCOPY WITH  RIGHT RETROGRADE AND RIGHT  STENT PLACEMENT;  Surgeon: [name], MD;  Location[facility];  Service: Urology;  Laterality: Right;  . CYSTOSCOPY WITH BIOPSY Bilateral 01/30/2019   Procedure: CYSTOSCOPY BILATERAL RETROGRADES  WITH BIOPSY AND FULGURATION BLADDER TUMOR INSTILL GEMCITABINE;  Surgeon: [name], MD;  Location: [facility];  Service: Urology;  Laterality: Bilateral;  . HIATAL HERNIA REPAIR  1980s   open  . TRANSURETHRAL RESECTION OF BLADDER TUMOR WITH MITOMYCIN-C N/A 08/23/2017   Procedure: TRANSURETHRAL RESECTION OF BLADDER TUMOR WITH POSSIBLE EPIRUBACIN;  Surgeon: [name], MD;  Location: [facility];  Service: Urology;  Laterality: N/A;  . TRANSURETHRAL RESECTION OF BLADDER TUMOR WITH MITOMYCIN-C Bilateral 07/09/2018   Procedure: CYSTOSCOPY  BILATERAL RETROGRADES TRANSURETHRAL RESECTION OF BLADDER TUMOR WITH GEMCITABINE INSTILLATION;  Surgeon: [name], MD;  Location: [facility];  Service: Urology;  Laterality: Bilateral;       Home Medications    Prior to Admission medications   Medication Sig Start Date End Date Taking? Authorizing Provider  Ascorbic Acid (VITAMIN C PO) Take by mouth.   Yes [provider]  VITAMIN D PO Take by mouth.   Yes [provider]  aspirin EC 81 MG tablet Take 81 mg by mouth daily.    [provider]  lisinopril (PRINIVIL,ZESTRIL) 20 MG tablet Take 20 mg by mouth every morning.  08/31/13   [provider]  lovastatin (MEVACOR) 40 MG tablet Take 80 mg by mouth at  bedtime.  10/20/13   [provider]  Multiple Vitamin (MULTIVITAMIN) tablet Take 1 tablet by mouth daily.    [provider]  Omega-3 Fatty Acids (FISH OIL) 1000 MG CAPS Take 2 capsules by mouth daily.    [provider]    Family History History reviewed. No pertinent family history.  Social History Social History   Tobacco Use  . Smoking status: Never Smoker  . Smokeless tobacco: Never Used  Substance Use Topics  . Alcohol use: No  . Drug use: No     Allergies   Patient has no known allergies.   Review of Systems Review of Systems   Constitutional: Negative for activity change, chills and fever.  HENT: Negative for nosebleeds and sinus pain.   Eyes: Negative for visual disturbance.  Respiratory: Negative for cough and shortness of breath.   Cardiovascular: Negative for chest pain and palpitations.  Gastrointestinal: Negative for abdominal pain, nausea and vomiting.  Genitourinary: Negative for flank pain, hematuria and testicular pain.  Musculoskeletal: Positive for arthralgias. Negative for back pain, gait problem, joint swelling, myalgias, neck pain and neck stiffness.  Skin: Positive for color change. Negative for rash.  Neurological: Negative for dizziness, syncope, facial asymmetry, speech difficulty, weakness, light-headedness and headaches.  Hematological: Does not bruise/bleed easily.  Psychiatric/Behavioral: Negative.   All other systems reviewed and are negative.    Physical Exam Triage Vital Signs ED Triage Vitals [04/19/19 1022]  Enc Vitals Group     BP      Pulse      Resp      Temp      Temp src      SpO2      Weight      Height      Head Circumference      Peak Flow      Pain Score 5     Pain Loc      Pain Edu?      Excl. in [place]?    No data found.  Updated Vital Signs BP 124/79 (BP Location: Right Arm)   Pulse (!) 56   Temp 98.3 F (36.8 C) (Oral)   Resp 18   SpO2 96%   Visual Acuity Right Eye Distance:   Left Eye Distance:   Bilateral Distance:    Right Eye Near:   Left Eye Near:    Bilateral Near:     Physical Exam Vitals and nursing note reviewed.  Constitutional:      General: He is not in acute distress.    Appearance: Normal appearance. He is well-developed and normal weight.  HENT:     Head: Normocephalic.     Comments: No cranial trauma noted. No ecchymosis noted. No facial ecchymosis noted. Patient has approximately 2cm x 2cm non- tender raised deformity to Left Frontal bone that is stated to have been present historically.    Right Ear: External ear normal.      Left Ear: External ear normal.     Ears:     Comments: No battles sign    Nose:     Comments: Abrasion on bridge of nose. No TTP. No epistaxis    Mouth/Throat:     Mouth: Mucous membranes are moist.  Eyes:     Extraocular Movements: Extraocular movements intact.     Conjunctiva/sclera: Conjunctivae normal.     Pupils: Pupils are equal, round, and reactive to light.  Cardiovascular:     Rate and Rhythm: Normal rate and regular  rhythm.     Pulses: Normal pulses.     Heart sounds: No murmur. No gallop.   Pulmonary:     Effort: Pulmonary effort is normal. No respiratory distress.     Breath sounds: Normal breath sounds. No wheezing.  Abdominal:     General: Abdomen is flat. Bowel sounds are normal.     Palpations: Abdomen is soft.     Tenderness: There is no abdominal tenderness.  Musculoskeletal:     Cervical back: Normal range of motion and neck supple. No tenderness.     Comments: Left hand- Edema and ecchymosis of 4th digit spreading proximal to mid- metacarpal on dorsum of hand. 4th digit has palmar displacement with shortening. Neurovascularly intact. Has pain with ROM  Lymphadenopathy:     Cervical: No cervical adenopathy.  Skin:    General: Skin is warm and dry.     Capillary Refill: Capillary refill takes less than 2 seconds.  Neurological:     General: No focal deficit present.     Mental Status: He is alert and oriented to person, place, and time.     Comments: Cranial Nerves intact to confrontation, CN I not tested. Gait stable on walk. 5/5 strength throughout. Sensation intact.  Psychiatric:        Mood and Affect: Mood normal.        Behavior: Behavior normal.        Thought Content: Thought content normal.        Judgment: Judgment normal.      UC Treatments / Results  Labs (all labs ordered are listed, but only abnormal results are displayed) Labs Reviewed - No data to display  EKG   Radiology DG Hand Complete Left  Result Date: 04/19/2019  CLINICAL DATA:  Fall yesterday. Left hand swelling. Painful fourth finger. EXAM: LEFT HAND - COMPLETE 3+ VIEW COMPARISON:  None. FINDINGS: There is a fracture through the proximal aspect of the proximal fourth phalanx with significant displacement. No other acute abnormalities. IMPRESSION: Fracture through the base of the proximal fourth phalanx with significant displacement. Electronically Signed   By: Dorise Bullion III M.D   On: 04/19/2019 10:39   On my measurement , Palmar displacement and shortening appears to be at least 53mm.  Procedures Procedures (including critical care time)  Medications Ordered in UC Medications - No data to display  Initial Impression / Assessment and Plan / UC Course  I have reviewed the triage vital signs and the nursing notes.  Pertinent labs & imaging results that were available during my care of the patient were reviewed by me and considered in my medical decision making (see chart for details).     #Fall # Displaced 4th Phalanx Fracture # Facial/Head Injury - Discussed with patient the concern for needing a CT scan of his head in order to rule out bleeding in his brain due to unclear of severity of head trauma and age, based on Up To Date guideline. While his exam is largely benign, inability to obtain collateral history and patients own admission he doesn't fully remember hitting his face, We agreed on plan to be further evaluated in the ED out of an abundance of precaution. In order to expedite care, will have finger fracture further addressed in ED.   Final Clinical Impressions(s) / UC Diagnoses   Final diagnoses:  Fall, initial encounter  Injury of head, initial encounter  Displaced fracture of proximal phalanx of left ring finger, initial encounter for closed fracture  Discharge Instructions     Go to the Emergency Department to be further evaluated for your fall injury to your face/head.  You will receive care for your finger injury  there as well.     ED Prescriptions    None     PDMP not reviewed this encounter.   Purnell Shoemaker, PA-C 04/19/19 1146    Symeon Puleo, Marguerita Beards, PA-C 04/19/19 1158

## 2019-04-19 NOTE — ED Triage Notes (Signed)
Pt tripped over curb in mall parking lot and fell onto concrete yesterday.  States he was in a hurry because it was cold. C/o pain, swelling, and bruising to L hand (4th and 5th digits) and abrasion to bridge of nose.  States he was seen at [facility] today and told L 4th finger was broken.  Denies LOC.  Ambulatory to triage.

## 2019-04-22 DIAGNOSIS — S62615A Displaced fracture of proximal phalanx of left ring finger, initial encounter for closed fracture: Secondary | ICD-10-CM | POA: Diagnosis not present

## 2019-04-22 DIAGNOSIS — R600 Localized edema: Secondary | ICD-10-CM | POA: Diagnosis not present

## 2019-04-22 DIAGNOSIS — M79645 Pain in left finger(s): Secondary | ICD-10-CM | POA: Diagnosis not present

## 2019-04-22 DIAGNOSIS — M25642 Stiffness of left hand, not elsewhere classified: Secondary | ICD-10-CM | POA: Diagnosis not present

## 2019-04-30 DIAGNOSIS — S62615A Displaced fracture of proximal phalanx of left ring finger, initial encounter for closed fracture: Secondary | ICD-10-CM | POA: Diagnosis not present

## 2019-04-30 DIAGNOSIS — W101XXD Fall (on)(from) sidewalk curb, subsequent encounter: Secondary | ICD-10-CM | POA: Diagnosis not present

## 2019-05-14 DIAGNOSIS — S62615A Displaced fracture of proximal phalanx of left ring finger, initial encounter for closed fracture: Secondary | ICD-10-CM | POA: Diagnosis not present

## 2019-05-14 DIAGNOSIS — R600 Localized edema: Secondary | ICD-10-CM | POA: Diagnosis not present

## 2019-05-14 DIAGNOSIS — M25642 Stiffness of left hand, not elsewhere classified: Secondary | ICD-10-CM | POA: Diagnosis not present

## 2019-05-14 DIAGNOSIS — S62615D Displaced fracture of proximal phalanx of left ring finger, subsequent encounter for fracture with routine healing: Secondary | ICD-10-CM | POA: Diagnosis not present

## 2019-05-28 DIAGNOSIS — M25642 Stiffness of left hand, not elsewhere classified: Secondary | ICD-10-CM | POA: Diagnosis not present

## 2019-05-28 DIAGNOSIS — R600 Localized edema: Secondary | ICD-10-CM | POA: Diagnosis not present

## 2019-05-28 DIAGNOSIS — S62615D Displaced fracture of proximal phalanx of left ring finger, subsequent encounter for fracture with routine healing: Secondary | ICD-10-CM | POA: Diagnosis not present

## 2019-06-09 ENCOUNTER — Ambulatory Visit: Payer: Medicare Other | Attending: Internal Medicine

## 2019-06-09 DIAGNOSIS — Z23 Encounter for immunization: Secondary | ICD-10-CM | POA: Insufficient documentation

## 2019-06-09 NOTE — Progress Notes (Signed)
   Covid-19 Vaccination Clinic  Name:  Zachary Ingram    MRN: [id number] DOB: 1936-01-31  06/09/2019  Zachary Ingram was observed post Covid-19 immunization for 15 minutes without incidence. He was provided with Vaccine Information Sheet and instruction to access the V-Safe system.   Zachary Ingram was instructed to call 911 with any severe reactions post vaccine: Marland Kitchen Difficulty breathing  . Swelling of your face and throat  . A fast heartbeat  . A bad rash all over your body  . Dizziness and weakness    Immunizations Administered    Name Date Dose VIS Date Route   Pfizer COVID-19 Vaccine 06/09/2019  1:54 PM 0.3 mL 04/11/2019 Intramuscular   Manufacturer: [place]   Lot: [id number]   [place]: [id number]

## 2019-06-11 DIAGNOSIS — R600 Localized edema: Secondary | ICD-10-CM | POA: Diagnosis not present

## 2019-06-11 DIAGNOSIS — M25642 Stiffness of left hand, not elsewhere classified: Secondary | ICD-10-CM | POA: Diagnosis not present

## 2019-06-11 DIAGNOSIS — M79645 Pain in left finger(s): Secondary | ICD-10-CM | POA: Diagnosis not present

## 2019-06-25 DIAGNOSIS — R600 Localized edema: Secondary | ICD-10-CM | POA: Diagnosis not present

## 2019-06-25 DIAGNOSIS — M25642 Stiffness of left hand, not elsewhere classified: Secondary | ICD-10-CM | POA: Diagnosis not present

## 2019-06-25 DIAGNOSIS — M79645 Pain in left finger(s): Secondary | ICD-10-CM | POA: Diagnosis not present

## 2019-07-04 ENCOUNTER — Ambulatory Visit: Payer: Medicare Other | Attending: Internal Medicine

## 2019-07-04 DIAGNOSIS — Z23 Encounter for immunization: Secondary | ICD-10-CM | POA: Insufficient documentation

## 2019-07-04 NOTE — Progress Notes (Signed)
   Covid-19 Vaccination Clinic  Name:  Zachary Ingram    MRN: [id number] DOB: 10-Apr-1936  07/04/2019  [name] was observed post Covid-19 immunization for 15 minutes without incident. He was provided with Vaccine Information Sheet and instruction to access the V-Safe system.   [name] was instructed to call 911 with any severe reactions post vaccine: Marland Kitchen Difficulty breathing  . Swelling of face and throat  . A fast heartbeat  . A bad rash all over body  . Dizziness and weakness   Immunizations Administered    Name Date Dose VIS Date Route   Pfizer COVID-19 Vaccine 07/04/2019  1:46 PM 0.3 mL 04/11/2019 Intramuscular   Manufacturer: [employer]   Lot: [id number]   [place]: [id number]

## 2019-07-08 DIAGNOSIS — S62615D Displaced fracture of proximal phalanx of left ring finger, subsequent encounter for fracture with routine healing: Secondary | ICD-10-CM | POA: Diagnosis not present

## 2019-07-09 DIAGNOSIS — S62615D Displaced fracture of proximal phalanx of left ring finger, subsequent encounter for fracture with routine healing: Secondary | ICD-10-CM | POA: Diagnosis not present

## 2019-07-09 DIAGNOSIS — M25642 Stiffness of left hand, not elsewhere classified: Secondary | ICD-10-CM | POA: Diagnosis not present

## 2019-07-14 DIAGNOSIS — E782 Mixed hyperlipidemia: Secondary | ICD-10-CM | POA: Diagnosis not present

## 2019-07-14 DIAGNOSIS — R7301 Impaired fasting glucose: Secondary | ICD-10-CM | POA: Diagnosis not present

## 2019-07-14 DIAGNOSIS — I10 Essential (primary) hypertension: Secondary | ICD-10-CM | POA: Diagnosis not present

## 2019-07-23 DIAGNOSIS — E782 Mixed hyperlipidemia: Secondary | ICD-10-CM | POA: Diagnosis not present

## 2019-07-23 DIAGNOSIS — R7301 Impaired fasting glucose: Secondary | ICD-10-CM | POA: Diagnosis not present

## 2019-07-23 DIAGNOSIS — I10 Essential (primary) hypertension: Secondary | ICD-10-CM | POA: Diagnosis not present

## 2019-08-11 DIAGNOSIS — Z8551 Personal history of malignant neoplasm of bladder: Secondary | ICD-10-CM | POA: Diagnosis not present

## 2019-08-19 DIAGNOSIS — S62615D Displaced fracture of proximal phalanx of left ring finger, subsequent encounter for fracture with routine healing: Secondary | ICD-10-CM | POA: Diagnosis not present

## 2019-08-20 DIAGNOSIS — C678 Malignant neoplasm of overlapping sites of bladder: Secondary | ICD-10-CM | POA: Diagnosis not present

## 2019-08-27 DIAGNOSIS — C678 Malignant neoplasm of overlapping sites of bladder: Secondary | ICD-10-CM | POA: Diagnosis not present

## 2019-08-27 DIAGNOSIS — Z5111 Encounter for antineoplastic chemotherapy: Secondary | ICD-10-CM | POA: Diagnosis not present

## 2019-09-03 DIAGNOSIS — C678 Malignant neoplasm of overlapping sites of bladder: Secondary | ICD-10-CM | POA: Diagnosis not present

## 2019-09-03 DIAGNOSIS — Z5111 Encounter for antineoplastic chemotherapy: Secondary | ICD-10-CM | POA: Diagnosis not present

## 2019-11-12 DIAGNOSIS — Z8551 Personal history of malignant neoplasm of bladder: Secondary | ICD-10-CM | POA: Diagnosis not present

## 2020-01-01 DIAGNOSIS — H35373 Puckering of macula, bilateral: Secondary | ICD-10-CM | POA: Diagnosis not present

## 2020-01-17 ENCOUNTER — Ambulatory Visit (HOSPITAL_COMMUNITY)
Admission: EM | Admit: 2020-01-17 | Discharge: 2020-01-17 | Disposition: A | Discharge status: Home / Self Care | Payer: Medicare Other | Attending: Family Medicine | Admitting: Family Medicine

## 2020-01-17 ENCOUNTER — Encounter (HOSPITAL_COMMUNITY): Payer: Self-pay | Admitting: Emergency Medicine

## 2020-01-17 ENCOUNTER — Other Ambulatory Visit: Payer: Self-pay

## 2020-01-17 DIAGNOSIS — H938X2 Other specified disorders of left ear: Secondary | ICD-10-CM

## 2020-01-17 DIAGNOSIS — T162XXA Foreign body in left ear, initial encounter: Secondary | ICD-10-CM

## 2020-01-17 NOTE — Discharge Instructions (Addendum)
We removed the end of the hearing aid from your left ear  Follow-up with audiology as needed

## 2020-01-17 NOTE — ED Triage Notes (Signed)
Pt presents with piece of hearing aid in left ear. States took hearing aids out last night and one broke off in ear.

## 2020-01-17 NOTE — ED Provider Notes (Signed)
[place]    CSN: [id number] Arrival date & time: 01/17/20  1213      History   Chief Complaint Chief Complaint  Patient presents with  . Otalgia    HPI Zachary Ingram is a 84 y.o. male.   Reports that a piece of his hearing aid broke off into his left ear last night when he was trying to remove his hearing aid.  Denies pain  Denies headache, cough, shortness of breath, nausea, vomiting, diarrhea, rash, fever, other symptoms  ROS per HPI     Otalgia   Past Medical History:  Diagnosis Date  . Bladder tumor   . History of hiatal hernia   . History of urinary retention 2015  . Hyperlipidemia   . Hypertension   . Wears hearing aid in both ears     There are no problems to display for this patient.   Past Surgical History:  Procedure Laterality Date  . CATARACT EXTRACTION W/ INTRAOCULAR LENS  IMPLANT, BILATERAL Bilateral 11/2016  . COLONOSCOPY  2016  approx.  . CYSTOSCOPY W/ URETERAL STENT PLACEMENT Right 08/23/2017   Procedure: CYSTOSCOPY WITH  RIGHT RETROGRADE AND RIGHT  STENT PLACEMENT;  Surgeon: [name], MD;  Location: [facility];  Service: Urology;  Laterality: Right;  . CYSTOSCOPY WITH BIOPSY Bilateral 01/30/2019   Procedure: CYSTOSCOPY BILATERAL RETROGRADES  WITH BIOPSY AND FULGURATION BLADDER TUMOR INSTILL GEMCITABINE;  Surgeon: [name], MD;  Location: [facility];  Service: Urology;  Laterality: Bilateral;  . HIATAL HERNIA REPAIR  1980s   open  . TRANSURETHRAL RESECTION OF BLADDER TUMOR WITH MITOMYCIN-C N/A 08/23/2017   Procedure: TRANSURETHRAL RESECTION OF BLADDER TUMOR WITH POSSIBLE EPIRUBACIN;  Surgeon: [name], MD;  Location[facility];  Service: Urology;  Laterality: N/A;  . TRANSURETHRAL RESECTION OF BLADDER TUMOR WITH MITOMYCIN-C Bilateral 07/09/2018   Procedure: CYSTOSCOPY  BILATERAL RETROGRADES TRANSURETHRAL RESECTION OF BLADDER TUMOR WITH GEMCITABINE INSTILLATION;  Surgeon: [name], MD;  Location: [facility];  Service: Urology;  Laterality: Bilateral;       Home Medications    Prior to Admission medications   Medication Sig Start Date End Date Taking? Authorizing Provider  Ascorbic Acid (VITAMIN C PO) Take by mouth.    [provider]  aspirin EC 81 MG tablet Take 81 mg by mouth daily.    [provider]  lisinopril (PRINIVIL,ZESTRIL) 20 MG tablet Take 20 mg by mouth every morning.  08/31/13   [provider]  lovastatin (MEVACOR) 40 MG tablet Take 80 mg by mouth at bedtime.  10/20/13   [provider]  Multiple Vitamin (MULTIVITAMIN) tablet Take 1 tablet by mouth daily.    [provider]  Omega-3 Fatty Acids (FISH OIL) 1000 MG CAPS Take 2 capsules by mouth daily.    [provider]  VITAMIN D PO Take by mouth.    [provider]    Family History History reviewed. No pertinent family history.  Social History Social History   Tobacco Use  . Smoking status: Never Smoker  . Smokeless tobacco: Never Used  Vaping Use  . Vaping Use: Never used  Substance Use Topics  . Alcohol use: No  . Drug use: No     Allergies   Patient has no known allergies.   Review of Systems Review of Systems  HENT: Positive for ear pain.      Physical Exam Triage Vital Signs ED Triage Vitals  Enc  Vitals Group     BP 01/17/20 1342 121/71     Pulse Rate 01/17/20 1342 (!) 54     Resp 01/17/20 1342 18     Temp 01/17/20 1342 98.1 F (36.7 C)     Temp Source 01/17/20 1342 Oral     SpO2 01/17/20 1342 100 %     Weight --      Height --      Head Circumference --      Peak Flow --      Pain Score 01/17/20 1341 0     Pain Loc --      Pain Edu? --      Excl. in [place]? --    No data found.  Updated Vital Signs BP 121/71 (BP Location: Right Arm)   Pulse (!) 54   Temp 98.1 F (36.7 C) (Oral)   Resp 18   SpO2 100%    Physical Exam HENT:     Left Ear: A foreign body is present.      Ears:     Comments: Removed rubber foreign body from the left ear canal, appeared to be a match to the patient's tip of the hearing aid      UC Treatments / Results  Labs (all labs ordered are listed, but only abnormal results are displayed) Labs Reviewed - No data to display  EKG   Radiology No results found.  Procedures Foreign Body Removal  Date/Time: 01/17/2020 4:00 PM Performed by: Faustino Congress, NP Authorized by: Faustino Congress, NP   Consent:    Consent obtained:  Verbal   Consent given by:  Patient   Risks discussed:  Incomplete removal   Alternatives discussed:  Alternative treatment Location:    Location:  Ear   Ear location:  L ear Procedure type:    Procedure complexity:  Simple Procedure details:    Removal mechanism:  Hemostat   Foreign bodies recovered:  1   Description:  Rubber round tip that matches hearing aid   Intact foreign body removal: yes   Post-procedure details:    Patient tolerance of procedure:  Tolerated well, no immediate complications   (including critical care time)  Medications Ordered in UC Medications - No data to display  Initial Impression / Assessment and Plan / UC Course  I have reviewed the triage vital signs and the nursing notes.  Pertinent labs & imaging results that were available during my care of the patient were reviewed by me and considered in my medical decision making (see chart for details).    Ear fullness Foreign body removal  Removed foreign body from left ear canal Was able to remove with hemostats Tolerated well Foreign body was intact upon removal Instructed to follow-up with PCP or this office as needed Final Clinical Impressions(s) / UC Diagnoses   Final diagnoses:  Ear fullness, left   Discharge Instructions   None    ED Prescriptions    None     PDMP not reviewed this encounter.   Faustino Congress, NP 01/17/20 [phone]

## 2020-01-22 DIAGNOSIS — E782 Mixed hyperlipidemia: Secondary | ICD-10-CM | POA: Diagnosis not present

## 2020-01-22 DIAGNOSIS — R7301 Impaired fasting glucose: Secondary | ICD-10-CM | POA: Diagnosis not present

## 2020-01-22 DIAGNOSIS — I10 Essential (primary) hypertension: Secondary | ICD-10-CM | POA: Diagnosis not present

## 2020-01-29 DIAGNOSIS — R7301 Impaired fasting glucose: Secondary | ICD-10-CM | POA: Diagnosis not present

## 2020-01-29 DIAGNOSIS — L918 Other hypertrophic disorders of the skin: Secondary | ICD-10-CM | POA: Diagnosis not present

## 2020-01-29 DIAGNOSIS — E782 Mixed hyperlipidemia: Secondary | ICD-10-CM | POA: Diagnosis not present

## 2020-01-29 DIAGNOSIS — M150 Primary generalized (osteo)arthritis: Secondary | ICD-10-CM | POA: Diagnosis not present

## 2020-01-29 DIAGNOSIS — Z23 Encounter for immunization: Secondary | ICD-10-CM | POA: Diagnosis not present

## 2020-01-29 DIAGNOSIS — Z0000 Encounter for general adult medical examination without abnormal findings: Secondary | ICD-10-CM | POA: Diagnosis not present

## 2020-01-29 DIAGNOSIS — C670 Malignant neoplasm of trigone of bladder: Secondary | ICD-10-CM | POA: Diagnosis not present

## 2020-03-29 DIAGNOSIS — Z8551 Personal history of malignant neoplasm of bladder: Secondary | ICD-10-CM | POA: Diagnosis not present

## 2020-06-30 DIAGNOSIS — Z8551 Personal history of malignant neoplasm of bladder: Secondary | ICD-10-CM | POA: Diagnosis not present

## 2020-07-14 DIAGNOSIS — K4090 Unilateral inguinal hernia, without obstruction or gangrene, not specified as recurrent: Secondary | ICD-10-CM | POA: Diagnosis not present

## 2020-07-19 DIAGNOSIS — M150 Primary generalized (osteo)arthritis: Secondary | ICD-10-CM | POA: Diagnosis not present

## 2020-07-19 DIAGNOSIS — E782 Mixed hyperlipidemia: Secondary | ICD-10-CM | POA: Diagnosis not present

## 2020-07-19 DIAGNOSIS — R7301 Impaired fasting glucose: Secondary | ICD-10-CM | POA: Diagnosis not present

## 2020-07-26 DIAGNOSIS — M150 Primary generalized (osteo)arthritis: Secondary | ICD-10-CM | POA: Diagnosis not present

## 2020-07-26 DIAGNOSIS — Z23 Encounter for immunization: Secondary | ICD-10-CM | POA: Diagnosis not present

## 2020-07-26 DIAGNOSIS — I10 Essential (primary) hypertension: Secondary | ICD-10-CM | POA: Diagnosis not present

## 2020-07-26 DIAGNOSIS — E782 Mixed hyperlipidemia: Secondary | ICD-10-CM | POA: Diagnosis not present

## 2020-07-26 DIAGNOSIS — R7301 Impaired fasting glucose: Secondary | ICD-10-CM | POA: Diagnosis not present

## 2020-08-23 ENCOUNTER — Ambulatory Visit: Payer: Self-pay | Admitting: Surgery

## 2020-08-23 DIAGNOSIS — K4090 Unilateral inguinal hernia, without obstruction or gangrene, not specified as recurrent: Secondary | ICD-10-CM | POA: Diagnosis not present

## 2020-08-23 NOTE — H&P (Signed)
History of Present Illness Zachary Ingram. Tomoko Sandra MD; 08/23/2020 11:29 AM) The patient is a 85 year old male who presents with an inguinal hernia. Referred by Dr. Ashby Dawes for [facility]  This is an 85 year old male in good health who presents with a visible palpable right inguinal bulge over the last month. He remains reducible. He denies any obstructive symptoms. He was examined by his primary care physician who felt that he had a right inguinal hernia. He has no symptoms on the left. No previous pelvic surgery. The patient is fairly active and walks every day for exercise. He does live alone.   Problem List/Past Medical Rodman Key K. Terressa Evola, MD; 08/23/2020 11:29 AM) RIGHT INGUINAL HERNIA (K40.90)  Diagnostic Studies History Jess Barters, CMA; 08/23/2020 9:14 AM) Colonoscopy 5-10 years ago  Allergies Jess Barters, CMA; 08/23/2020 9:14 AM) No Known Drug Allergies [08/23/2020]: Allergies Reconciled  Medication History Jess Barters, CMA; 08/23/2020 9:14 AM) Lisinopril (20MG  Tablet, Oral) Active. Lovastatin (40MG  Tablet, Oral) Active. Fish Oil (300MG  Capsule, Oral) Active. Metamucil (0.52GM Capsule, Oral) Active. Medications Reconciled  Social History Jess Barters, CMA; 08/23/2020 9:14 AM) Caffeine use Coffee. No alcohol use No drug use Tobacco use Never smoker.  Family History Jess Barters, [place]; 08/23/2020 9:14 AM) Arthritis Mother, Sister. Heart Disease Brother, Father. Heart disease in male family member before age 52  Other Problems Zachary Ingram. Emmani Lesueur, MD; 08/23/2020 11:29 AM) Bladder Problems Cancer High blood pressure Hypercholesterolemia     Review of Systems Jess Barters CMA; 08/23/2020 9:14 AM) General Not Present- Appetite Loss, Chills, Fatigue, Fever, Night Sweats, Weight Gain and Weight Loss. Skin Not Present- Change in Wart/Mole, Dryness, Hives, Jaundice, New Lesions, Non-Healing Wounds, Rash and Ulcer. HEENT Present- Hearing Loss and Ringing in the Ears.  Not Present- Earache, Hoarseness, Nose Bleed, Oral Ulcers, Seasonal Allergies, Sinus Pain, Sore Throat, Visual Disturbances, Wears glasses/contact lenses and Yellow Eyes. Breast Not Present- Breast Mass, Breast Pain, Nipple Discharge and Skin Changes. Cardiovascular Not Present- Chest Pain, Difficulty Breathing Lying Down, Leg Cramps, Palpitations, Rapid Heart Rate, Shortness of Breath and Swelling of Extremities. Gastrointestinal Present- Abdominal Pain. Not Present- Bloating, Bloody Stool, Change in Bowel Habits, Chronic diarrhea, Constipation, Difficulty Swallowing, Excessive gas, Gets full quickly at meals, Hemorrhoids, Indigestion, Nausea, Rectal Pain and Vomiting. Male Genitourinary Not Present- Blood in Urine, Change in Urinary Stream, Frequency, Impotence, Nocturia, Painful Urination, Urgency and Urine Leakage. Musculoskeletal Not Present- Back Pain, Joint Pain, Joint Stiffness, Muscle Pain, Muscle Weakness and Swelling of Extremities. Neurological Not Present- Decreased Memory, Fainting, Headaches, Numbness, Seizures, Tingling, Tremor, Trouble walking and Weakness. Psychiatric Not Present- Anxiety, Bipolar, Change in Sleep Pattern, Depression, Fearful and Frequent crying. Endocrine Not Present- Cold Intolerance, Excessive Hunger, Hair Changes, Heat Intolerance, Hot flashes and New Diabetes. Hematology Not Present- Blood Thinners, Easy Bruising, Excessive bleeding, Gland problems, HIV and Persistent Infections.  Vitals Jess Barters CMA; 08/23/2020 9:15 AM) 08/23/2020 9:14 AM Weight: 168.5 lb Height: 66in Body Surface Area: 1.86 m Body Mass Index: 27.2 kg/m  Temp.: 97.59F  Pulse: 86 (Regular)  P.OX: 97% (Room air) BP: 150/82(Sitting, Left Arm, Standard)        Physical Exam Rodman Key K. Anntonette Madewell MD; 08/23/2020 11:30 AM)  The physical exam findings are as follows: Note:Constitutional: WDWN in NAD, conversant, no obvious deformities; resting comfortably Eyes: Pupils equal,  round; sclera anicteric; moist conjunctiva; no lid lag HENT: Oral mucosa moist; good dentition Neck: No masses palpated, trachea midline; no thyromegaly Lungs: CTA bilaterally; normal respiratory effort CV: Regular rate and rhythm; no murmurs;  extremities well-perfused with no edema Abd: +bowel sounds, soft, non-tender, no palpable organomegaly; no palpable hernias GU: Bilateral descended testes, no testicular masses, visible reducible right inguinal hernia, no sign of left inguinal hernia Musc: Normal gait; no apparent clubbing or cyanosis in extremities Lymphatic: No palpable cervical or axillary lymphadenopathy Skin: Warm, dry; no sign of jaundice Psychiatric - alert and oriented x 4; calm mood and affect    Assessment & Plan Rodman Key K. Citlali Gautney MD; 08/23/2020 9:28 AM)  RIGHT INGUINAL HERNIA (K40.90)  Current Plans Schedule for Surgery - Right inguinal hernia repair with mesh. The surgical procedure has been discussed with the patient. Potential risks, benefits, alternative treatments, and expected outcomes have been explained. All of the patient's questions at this time have been answered. The likelihood of reaching the patient's treatment goal is good. The patient understand the proposed surgical procedure and wishes to proceed.  Zachary Ingram. Georgette Dover, MD, [facility] Surgery  General/ Trauma Surgery   08/23/2020 11:30 AM

## 2020-09-07 DIAGNOSIS — G8918 Other acute postprocedural pain: Secondary | ICD-10-CM | POA: Diagnosis not present

## 2020-09-07 DIAGNOSIS — K4090 Unilateral inguinal hernia, without obstruction or gangrene, not specified as recurrent: Secondary | ICD-10-CM | POA: Diagnosis not present

## 2021-01-28 DIAGNOSIS — Z8551 Personal history of malignant neoplasm of bladder: Secondary | ICD-10-CM | POA: Diagnosis not present

## 2021-03-21 IMAGING — DX DG FINGER RING 2+V*L*
3 series · 3 of 3 positions shown · non-contrast
Comparison: Hand radiographs same date.

CLINICAL DATA: Pain and swelling in the left hand. Proximal 4th
phalangeal fracture.

EXAM:
LEFT RING FINGER 2+V

[finger ap]
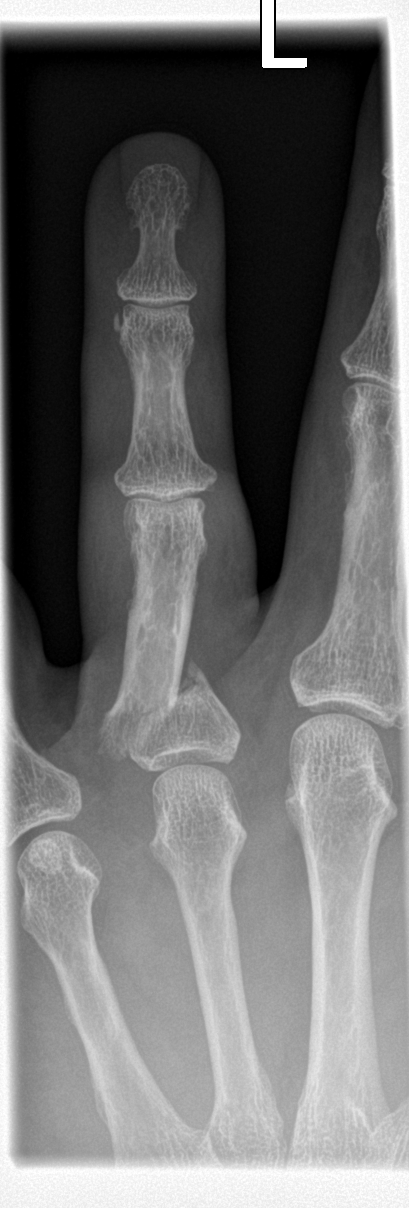

[finger obl]
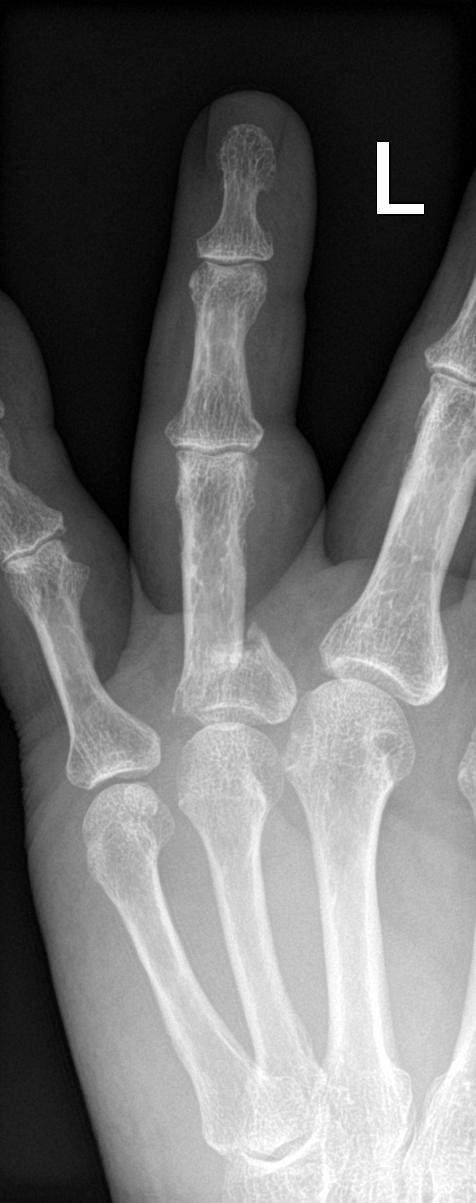

[finger lat]
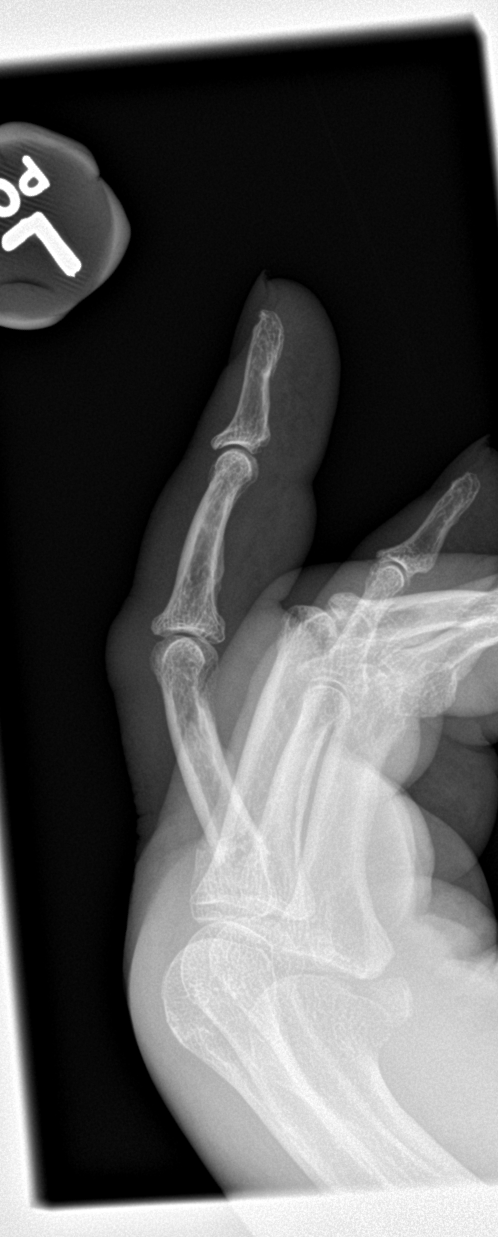

[3 of 3 positions shown; findings below may reference images not displayed]

FINDINGS: The bones are mildly demineralized. Oblique extra-articular fracture
through the base of the 4th proximal phalanx remains mildly
displaced in an ulnar and palmar direction. The alignment is
unchanged from the previous study. There is no intra-articular
extension of the fracture. Mild interphalangeal degenerative changes
are present.
IMPRESSION: Stable appearance of the extra-articular fracture of the 4th
proximal phalanx.

## 2021-03-21 IMAGING — DX DG HAND COMPLETE 3+V*L*
3 series · 3 of 3 positions shown · non-contrast
Comparison: None.

CLINICAL DATA: Fall yesterday. Left hand swelling. Painful fourth
finger.

EXAM:
LEFT HAND - COMPLETE 3+ VIEW

[hand pa]
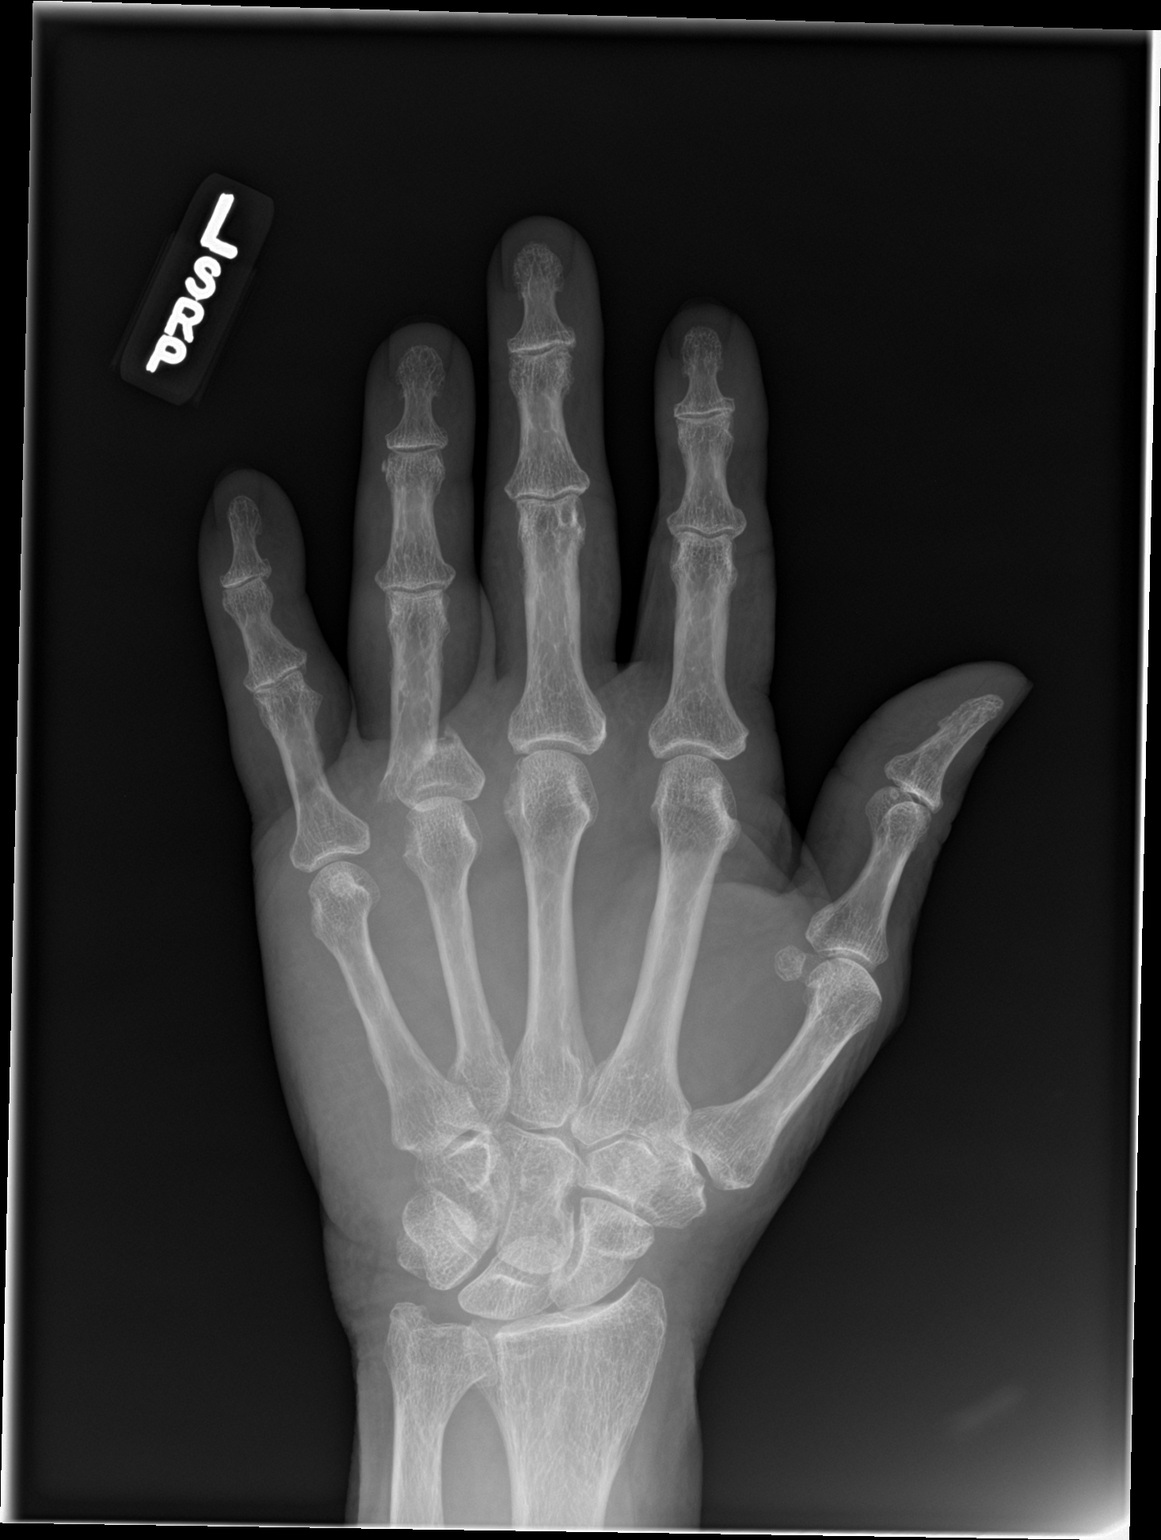

[hand obl]
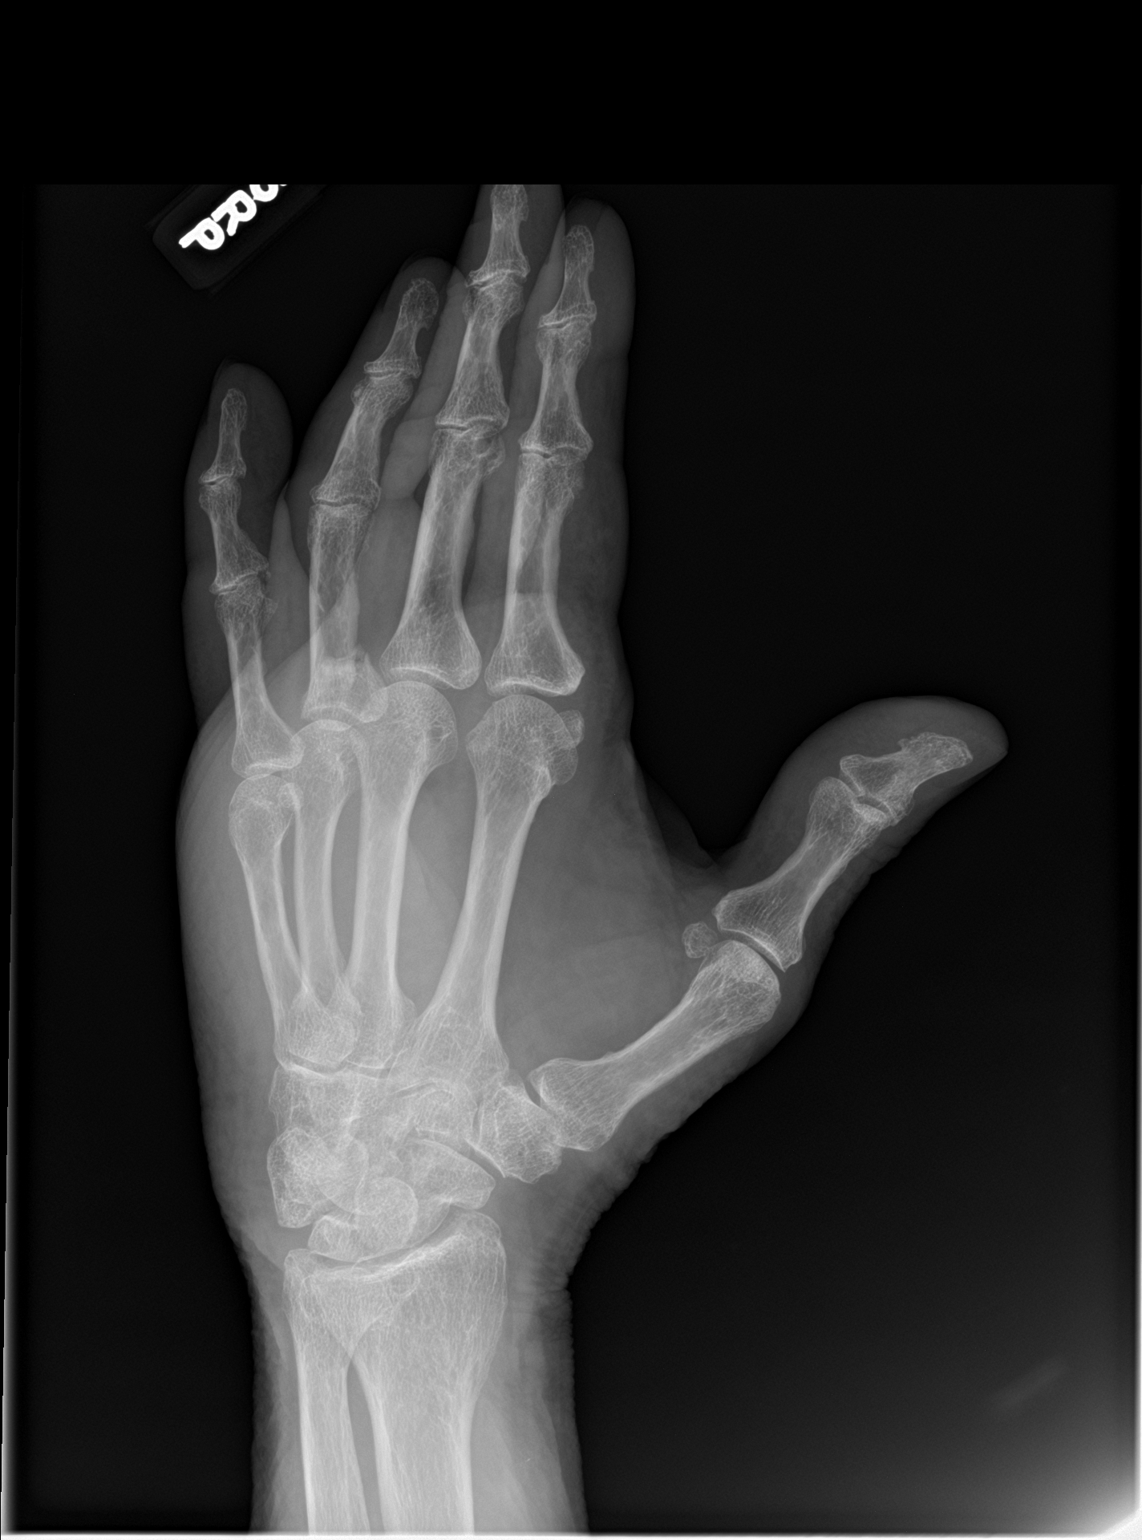

[hand lat]
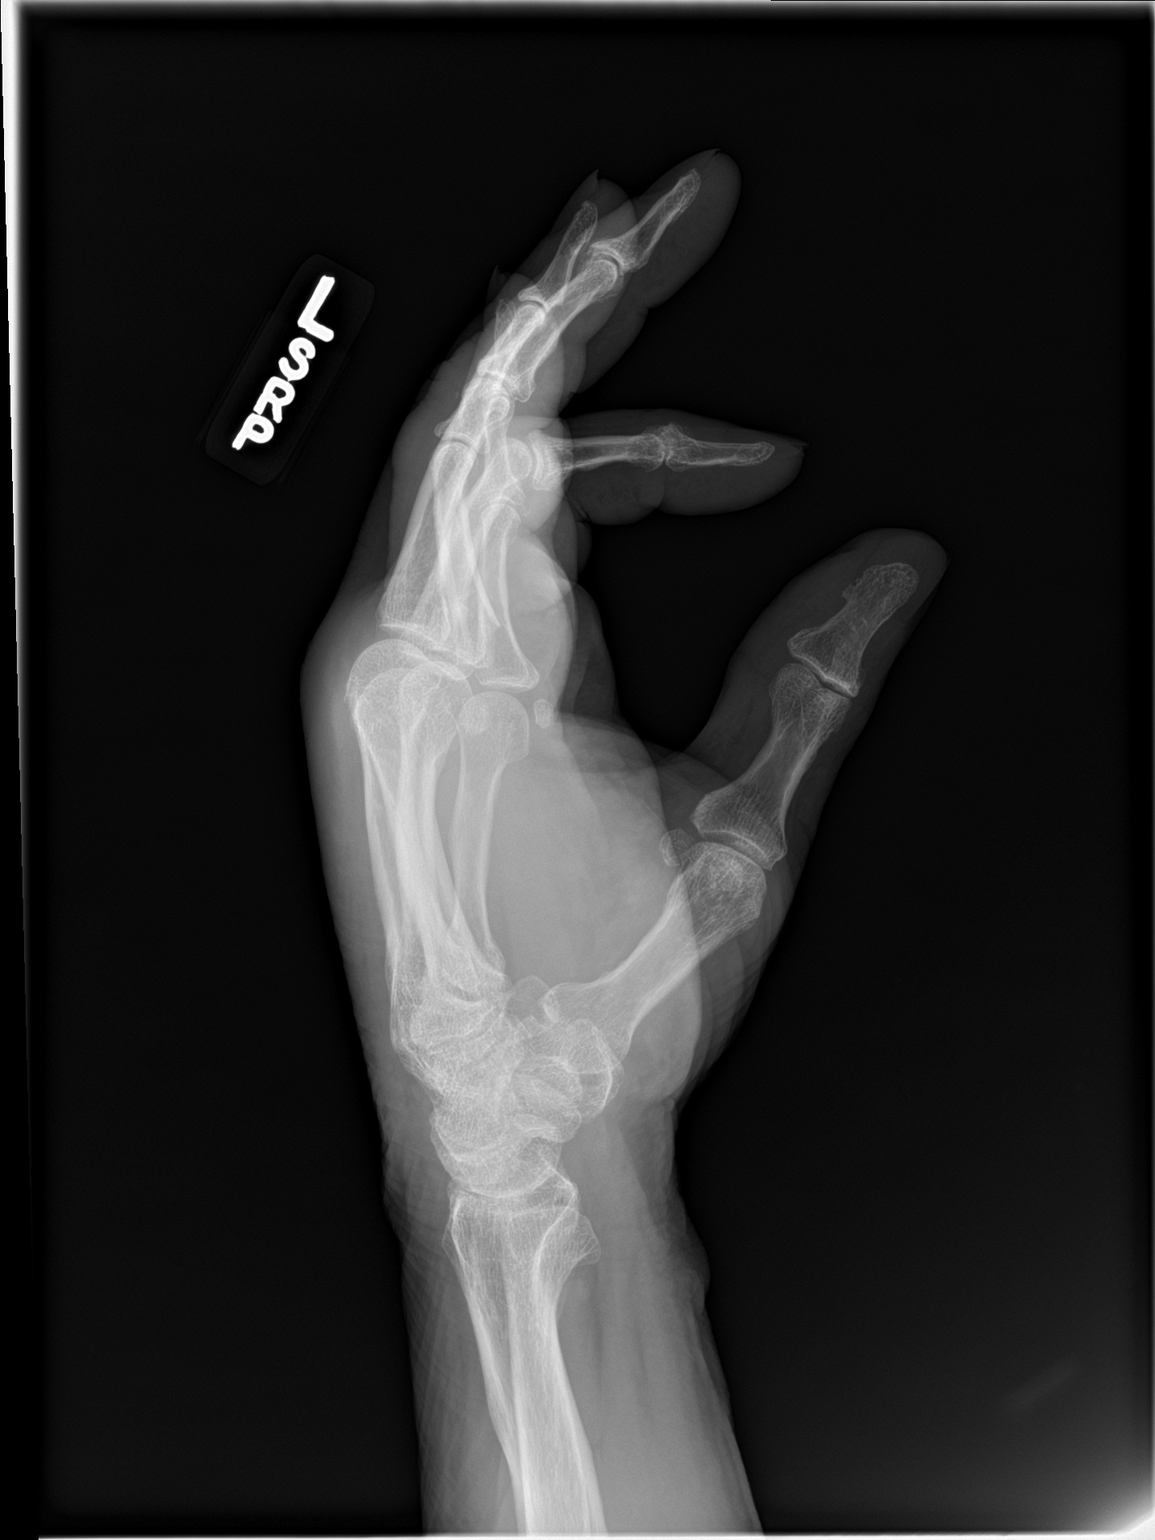

[3 of 3 positions shown; findings below may reference images not displayed]

FINDINGS: There is a fracture through the proximal aspect of the proximal
fourth phalanx with significant displacement. No other acute
abnormalities.
IMPRESSION: Fracture through the base of the proximal fourth phalanx with
significant displacement.

## 2021-03-30 DIAGNOSIS — M150 Primary generalized (osteo)arthritis: Secondary | ICD-10-CM | POA: Diagnosis not present

## 2021-03-30 DIAGNOSIS — I10 Essential (primary) hypertension: Secondary | ICD-10-CM | POA: Diagnosis not present

## 2021-03-30 DIAGNOSIS — E782 Mixed hyperlipidemia: Secondary | ICD-10-CM | POA: Diagnosis not present

## 2021-08-15 DIAGNOSIS — E782 Mixed hyperlipidemia: Secondary | ICD-10-CM | POA: Diagnosis not present

## 2021-08-15 DIAGNOSIS — R5383 Other fatigue: Secondary | ICD-10-CM | POA: Diagnosis not present

## 2021-08-15 DIAGNOSIS — Z0000 Encounter for general adult medical examination without abnormal findings: Secondary | ICD-10-CM | POA: Diagnosis not present

## 2021-08-15 DIAGNOSIS — R7301 Impaired fasting glucose: Secondary | ICD-10-CM | POA: Diagnosis not present

## 2021-08-15 DIAGNOSIS — I10 Essential (primary) hypertension: Secondary | ICD-10-CM | POA: Diagnosis not present

## 2021-08-22 DIAGNOSIS — C439 Malignant melanoma of skin, unspecified: Secondary | ICD-10-CM | POA: Diagnosis not present

## 2021-08-22 DIAGNOSIS — Z0000 Encounter for general adult medical examination without abnormal findings: Secondary | ICD-10-CM | POA: Diagnosis not present

## 2021-08-22 DIAGNOSIS — Z23 Encounter for immunization: Secondary | ICD-10-CM | POA: Diagnosis not present

## 2021-08-22 DIAGNOSIS — N182 Chronic kidney disease, stage 2 (mild): Secondary | ICD-10-CM | POA: Diagnosis not present

## 2021-08-22 DIAGNOSIS — R5383 Other fatigue: Secondary | ICD-10-CM | POA: Diagnosis not present

## 2021-08-22 DIAGNOSIS — R7301 Impaired fasting glucose: Secondary | ICD-10-CM | POA: Diagnosis not present

## 2021-08-22 DIAGNOSIS — I10 Essential (primary) hypertension: Secondary | ICD-10-CM | POA: Diagnosis not present

## 2021-08-22 DIAGNOSIS — E782 Mixed hyperlipidemia: Secondary | ICD-10-CM | POA: Diagnosis not present

## 2021-08-22 DIAGNOSIS — M150 Primary generalized (osteo)arthritis: Secondary | ICD-10-CM | POA: Diagnosis not present

## 2022-01-31 DIAGNOSIS — C678 Malignant neoplasm of overlapping sites of bladder: Secondary | ICD-10-CM | POA: Diagnosis not present

## 2022-01-31 DIAGNOSIS — Z8551 Personal history of malignant neoplasm of bladder: Secondary | ICD-10-CM | POA: Diagnosis not present

## 2022-08-28 DIAGNOSIS — R7301 Impaired fasting glucose: Secondary | ICD-10-CM | POA: Diagnosis not present

## 2022-08-28 DIAGNOSIS — E782 Mixed hyperlipidemia: Secondary | ICD-10-CM | POA: Diagnosis not present

## 2022-08-28 DIAGNOSIS — I10 Essential (primary) hypertension: Secondary | ICD-10-CM | POA: Diagnosis not present

## 2022-08-28 DIAGNOSIS — Z0000 Encounter for general adult medical examination without abnormal findings: Secondary | ICD-10-CM | POA: Diagnosis not present

## 2022-08-28 DIAGNOSIS — Z23 Encounter for immunization: Secondary | ICD-10-CM | POA: Diagnosis not present

## 2022-08-28 DIAGNOSIS — R5383 Other fatigue: Secondary | ICD-10-CM | POA: Diagnosis not present

## 2022-09-04 DIAGNOSIS — N182 Chronic kidney disease, stage 2 (mild): Secondary | ICD-10-CM | POA: Diagnosis not present

## 2022-09-04 DIAGNOSIS — E782 Mixed hyperlipidemia: Secondary | ICD-10-CM | POA: Diagnosis not present

## 2022-09-04 DIAGNOSIS — R7301 Impaired fasting glucose: Secondary | ICD-10-CM | POA: Diagnosis not present

## 2022-09-04 DIAGNOSIS — R5383 Other fatigue: Secondary | ICD-10-CM | POA: Diagnosis not present

## 2022-09-04 DIAGNOSIS — Z0000 Encounter for general adult medical examination without abnormal findings: Secondary | ICD-10-CM | POA: Diagnosis not present

## 2022-09-04 DIAGNOSIS — M150 Primary generalized (osteo)arthritis: Secondary | ICD-10-CM | POA: Diagnosis not present

## 2022-09-04 DIAGNOSIS — I10 Essential (primary) hypertension: Secondary | ICD-10-CM | POA: Diagnosis not present

## 2023-01-08 DIAGNOSIS — Z8551 Personal history of malignant neoplasm of bladder: Secondary | ICD-10-CM | POA: Diagnosis not present

## 2023-01-17 DIAGNOSIS — N3289 Other specified disorders of bladder: Secondary | ICD-10-CM | POA: Diagnosis not present

## 2023-01-17 DIAGNOSIS — C679 Malignant neoplasm of bladder, unspecified: Secondary | ICD-10-CM | POA: Diagnosis not present

## 2023-01-17 DIAGNOSIS — N201 Calculus of ureter: Secondary | ICD-10-CM | POA: Diagnosis not present

## 2023-01-17 DIAGNOSIS — N320 Bladder-neck obstruction: Secondary | ICD-10-CM | POA: Diagnosis not present

## 2023-01-17 DIAGNOSIS — C678 Malignant neoplasm of overlapping sites of bladder: Secondary | ICD-10-CM | POA: Diagnosis not present

## 2023-01-29 DIAGNOSIS — Z8551 Personal history of malignant neoplasm of bladder: Secondary | ICD-10-CM | POA: Diagnosis not present

## 2023-03-12 DIAGNOSIS — Z8551 Personal history of malignant neoplasm of bladder: Secondary | ICD-10-CM | POA: Diagnosis not present

## 2023-03-12 DIAGNOSIS — N201 Calculus of ureter: Secondary | ICD-10-CM | POA: Diagnosis not present

## 2023-03-13 ENCOUNTER — Other Ambulatory Visit: Payer: Self-pay | Admitting: Urology

## 2023-03-14 NOTE — Progress Notes (Signed)
Pre op phone call completed. Arrive at [facility].  NPO after midnight.  Stopping Aspirin and vitamins.

## 2023-03-19 ENCOUNTER — Encounter (HOSPITAL_BASED_OUTPATIENT_CLINIC_OR_DEPARTMENT_OTHER)
Admission: RE | Disposition: A | Discharge status: Home / Self Care | Payer: Self-pay | Source: Home / Self Care | Attending: Urology

## 2023-03-19 ENCOUNTER — Ambulatory Visit (HOSPITAL_BASED_OUTPATIENT_CLINIC_OR_DEPARTMENT_OTHER)
Admission: RE | Admit: 2023-03-19 | Discharge: 2023-03-19 | Disposition: A | Discharge status: Home / Self Care | Payer: Medicare Other | Attending: Urology | Admitting: Urology

## 2023-03-19 ENCOUNTER — Ambulatory Visit (HOSPITAL_COMMUNITY): Payer: Medicare Other

## 2023-03-19 ENCOUNTER — Other Ambulatory Visit: Payer: Self-pay

## 2023-03-19 ENCOUNTER — Encounter (HOSPITAL_BASED_OUTPATIENT_CLINIC_OR_DEPARTMENT_OTHER): Payer: Self-pay | Admitting: Urology

## 2023-03-19 DIAGNOSIS — Z8551 Personal history of malignant neoplasm of bladder: Secondary | ICD-10-CM | POA: Insufficient documentation

## 2023-03-19 DIAGNOSIS — N200 Calculus of kidney: Secondary | ICD-10-CM

## 2023-03-19 DIAGNOSIS — R3121 Asymptomatic microscopic hematuria: Secondary | ICD-10-CM | POA: Insufficient documentation

## 2023-03-19 DIAGNOSIS — R195 Other fecal abnormalities: Secondary | ICD-10-CM | POA: Diagnosis not present

## 2023-03-19 DIAGNOSIS — N201 Calculus of ureter: Secondary | ICD-10-CM | POA: Diagnosis not present

## 2023-03-19 HISTORY — PX: EXTRACORPOREAL SHOCK WAVE LITHOTRIPSY: SHX1557

## 2023-03-19 SURGERY — LITHOTRIPSY, ESWL
Anesthesia: LOCAL | Laterality: Right

## 2023-03-19 MED ORDER — DIPHENHYDRAMINE HCL 25 MG PO CAPS
25.0000 mg | ORAL_CAPSULE | ORAL | Status: AC
  Administered 2023-03-19: 25 mg via ORAL

## 2023-03-19 MED ORDER — DIAZEPAM 5 MG PO TABS
10.0000 mg | ORAL_TABLET | ORAL | Status: AC
  Administered 2023-03-19: 10 mg via ORAL

## 2023-03-19 MED ORDER — CIPROFLOXACIN HCL 500 MG PO TABS
ORAL_TABLET | ORAL | Status: AC
  Filled 2023-03-19: qty 1

## 2023-03-19 MED ORDER — TRAMADOL HCL 50 MG PO TABS: 50.0000 mg | ORAL_TABLET | Freq: Four times a day (QID) | ORAL | 0 refills | Status: DC | PRN

## 2023-03-19 MED ORDER — CIPROFLOXACIN HCL 500 MG PO TABS
500.0000 mg | ORAL_TABLET | ORAL | Status: AC
  Administered 2023-03-19: 500 mg via ORAL

## 2023-03-19 MED ORDER — SODIUM CHLORIDE 0.9 % IV SOLN: INTRAVENOUS | Status: DC

## 2023-03-19 MED ORDER — DIAZEPAM 5 MG PO TABS
ORAL_TABLET | ORAL | Status: AC
  Filled 2023-03-19: qty 2

## 2023-03-19 MED ORDER — DIPHENHYDRAMINE HCL 25 MG PO CAPS
ORAL_CAPSULE | ORAL | Status: AC
  Filled 2023-03-19: qty 1

## 2023-03-19 NOTE — Discharge Instructions (Signed)
See [facility] discharge instructions in chart.  

## 2023-03-19 NOTE — Op Note (Signed)
See [facility] OP note scanned into chart. Also because of the size, density, location and other factors that cannot be anticipated I feel this will likely be a staged procedure. This fact supersedes any indication in the scanned [facility] operative note to the contrary.  

## 2023-03-20 ENCOUNTER — Encounter (HOSPITAL_BASED_OUTPATIENT_CLINIC_OR_DEPARTMENT_OTHER): Payer: Self-pay | Admitting: Urology

## 2023-03-23 NOTE — H&P (Signed)
03/12/2023: CT imaging obtained at time of last exam as part of ongoing bladder cancer surveillance showed a nonobstructing distal right ureteral calculi. The patient has been grossly asymptomatic in regards to this without noted changes in baseline urinary frequency/urgency, denies changes in force of stream. He has had no correlating right-sided pain or discomfort suggestive of obstructive uropathy. Denies any dysuria or gross hematuria.     ALLERGIES: No Allergies    MEDICATIONS: Aspirin Ec 81 mg tablet, delayed release Oral  Fish Oil CAPS Oral  Lisinopril 10 mg tablet Oral  Lovastatin 40 mg tablet  Multivitamin     GU PSH: Bladder Instill AntiCA Agent - 2021, 2021, 2021, 2020, 2020, 2020, 2020, 2020, 2020, 2020, 2020, 2020, 2020, 2020, 2020, 2020, 2020 Cysto Fulgurate < 0.5 cm - 2020 Cysto Remove Stent FB Sim - 2019 Cystoscopy - 01/29/2023, 01/31/2022, 2022, 2022, 2021, 2021, 2021, 2020, 2020, 2020, 2019 Cystoscopy Insert Stent, Right - 2019 Cystoscopy TURBT <2 cm - 2020, 2020 Cystoscopy TURBT 2-5 cm - 2019 Locm 300-399Mg /Ml Iodine,1Ml - 01/17/2023, 2019 Vasectomy - 1982       PSH Notes: Jaw Surgery   NON-GU PSH: Hernia Repair     GU PMH: History of bladder cancer, No recurrent tumors seen. cysto in a year. - 01/29/2023, No recurrent tumors seen. He will return in a year with a CT hematuria study for cystoscopy. , - 01/31/2022, No recurrent tumors seen. he will return in a year for cystoscopy. , - 2022, No recurrences seen. f/u in 6 months for cystoscopy. , - 2022, He has no recurrent tumors. I will not be able to give him maintenance BCG with the shortage. He will return in 3 months for cystoscopy. , - 2021, He has no recurrent disease. I will have him return in 6 months for cystoscopy. , - 2019 Ureteral calculus, He has a 4x24mm right distal stone without obstruction. I will contact him to discuss this since I failed to mention it at the visit. - 01/29/2023 Bladder Cancer overlapping  sites - 01/17/2023, He has multifocal Ta tumors and is intermediate risk. He is going to need BCG induction and 1 year of maintenance. I have reviewed the side effects and risks of the BCG treatment. , - 2020 BPH w/o LUTS, He has trilobar hyperplasia with obstruction but no LUTS. - 2020 Bladder Cancer Dome, He has multiple small recurrent tumors. I will get him set up for cystoscopy, bil RTG's, TURBT and instillation of gemcitabine. I reviewed the risks of bleeding, infection, bladder injury, need for a stent or secondary procedures, chemical cystitis, thrombotic events and anesthetic complications. - 2020 Bladder Cancer Lateral - 2020 Bladder Cancer Trigone , He had low grade superficial disease. I will have him return in 6 weeks for a renal US and in 3 months for cystoscopy. - 2019, He has an 11mm right trigonal lesion that is worrisome for UCCa. I am going to get him set up for cystoscopy with TURBT, possible right stent and possible epirubicin instillation. I reviewed the risks of bleeding, infection, bladder injury, need for a stent and secondary procedures, chemical cystitis, thrombotic events and anesthetic complications. , - 2019 Microscopic hematuria (Stable), I will get culture today. - 2019, He has significant microhematuria and needs eval with CT and possible cystoscopy. , - 2019 Renal calculus - 2019 BPH w/LUTS, Benign prostatic hyperplasia with urinary obstruction - 2016 Dysuria, Dysuria - 2016 Nocturia, Nocturia - 2016 Unil Inguinal Hernia W/O obst or gang,non-recurrent, Inguinal hernia, right -  May 01, 2015 Urinary Retention, Unspec, Urinary retention - 2015 Gross hematuria, Gross hematuria - 2015    NON-GU PMH: Solitary pulmonary nodule, He will need a Chest CT for further evaluation. I will discuss that with him at his post op visit. Apr 30, 2018 Encounter for general adult medical examination without abnormal findings, Encounter for preventive health examination - May 01, 2015 Personal history of other  diseases of the circulatory system, History of hypertension - 2014-04-30 Personal history of other endocrine, nutritional and metabolic disease, History of hypercholesterolemia - 30-Apr-2014    FAMILY HISTORY: Death of family member - Runs In Family   SOCIAL HISTORY: Marital Status: Married Preferred Language: English; Race: White Current Smoking Status: Patient has never smoked.   Tobacco Use Assessment Completed: Used Tobacco in last 30 days? Drinks 3 caffeinated drinks per day.     Notes: Retired, Divorced, No alcohol use, Never a smoker, Daily caffeine consumption, 2-3 servings a day, One child   REVIEW OF SYSTEMS:    GU Review Male:   Patient reports frequent urination. Patient denies burning/ pain with urination, trouble starting your stream, have to strain to urinate , erection problems, get up at night to urinate, stream starts and stops, hard to postpone urination, penile pain, and leakage of urine.  Gastrointestinal (Upper):   Patient denies nausea, vomiting, and indigestion/ heartburn.  Gastrointestinal (Lower):   Patient denies diarrhea and constipation.  Constitutional:   Patient denies fever, night sweats, weight loss, and fatigue.  Skin:   Patient denies skin rash/ lesion and itching.  Eyes:   Patient denies blurred vision and double vision.  Ears/ Nose/ Throat:   Patient denies sore throat and sinus problems.  Hematologic/Lymphatic:   Patient denies swollen glands and easy bruising.  Cardiovascular:   Patient denies leg swelling and chest pains.  Respiratory:   Patient denies cough and shortness of breath.  Endocrine:   Patient denies excessive thirst.  Musculoskeletal:   Patient denies back pain and joint pain.  Neurological:   Patient denies headaches and dizziness.  Psychologic:   Patient denies depression and anxiety.   Notes: Updated from previous visit 01/29/2023 with review from patient as noted above.   VITAL SIGNS:      03/12/2023 12:46 PM  BP 141/83 mmHg  Pulse 76  /min  Temperature 98.0 F / 36.6 C   MULTI-SYSTEM PHYSICAL EXAMINATION:    Constitutional: Well-nourished. No physical deformities. Normally developed. Good grooming.  Neck: Neck symmetrical, not swollen. Normal tracheal position.  Respiratory: Normal breath sounds. No labored breathing, no use of accessory muscles.   Cardiovascular: Regular rate and rhythm. No murmur, no gallop, no swelling, no varicosities.   Skin: No paleness, no jaundice, no cyanosis. No lesion, no ulcer, no rash.  Neurologic / Psychiatric: Oriented to time, oriented to place, oriented to person. No depression, no anxiety, no agitation.  Gastrointestinal: No mass, no tenderness, no rigidity, non obese abdomen.  Musculoskeletal: Normal gait and station of head and neck.     Complexity of Data:  Source Of History:  Patient, Medical Record Summary  Records Review:   Pathology Reports, Previous Doctor Records, Previous Hospital Records, Previous Patient Records  Urine Test Review:   Urinalysis  X-Ray Review: KUB: Reviewed Films. Discussed With Patient.  C.T. Abdomen/Pelvis: Reviewed Films. Reviewed Report.     09/24/14  PSA  Total PSA 2.16     PROCEDURES:         KUB - 74018  A single view of the abdomen is obtained. Correlating  with recent CT imaging, there is an approximately 4.8 x8.53mm opacity consistent with a distal right ureteral calculi easily seen on today's KUB study. The stone itself has a teardrop shape and appearance. No other obvious opacity consistent with renal or ureteral calculi identified on today's exam.      Patient confirmed No Neulasta OnPro Device.           Visit Complexity - G2211          Urinalysis w/Scope Dipstick Dipstick Cont'd Micro  Color: Yellow Bilirubin: Neg mg/dL WBC/hpf: 0 - 5/hpf  Appearance: Clear Ketones: Neg mg/dL RBC/hpf: 3 - 28/UXL  Specific Gravity: 1.015 Blood: Neg ery/uL Bacteria: NS (Not Seen)  pH: 7.5 Protein: 1+ mg/dL Cystals: NS (Not Seen)  Glucose: Neg  mg/dL Urobilinogen: 0.2 mg/dL Casts: NS (Not Seen)    Nitrites: Neg Trichomonas: Not Present    Leukocyte Esterase: Neg leu/uL Mucous: Not Present      Epithelial Cells: NS (Not Seen)      Yeast: NS (Not Seen)      Sperm: Not Present    ASSESSMENT:      ICD-10 Details  1 GU:   Ureteral calculus - N20.1 Right, Acute, Uncomplicated  2   Microscopic hematuria - R31.21 Undiagnosed New Problem  3   History of bladder cancer - Z85.51 Chronic, Stable   PLAN:           Orders Labs Urine Culture          Schedule Return Visit/Planned Activity: Next Available Appointment - Schedule Surgery          Document Letter(s):  Created for Patient: Clinical Summary         Notes:   He has an easily identifiable distal right ureteral calculi on today's KUB study. There is a new finding of microscopic hematuria on today's UA as well. He is asymptomatic in regards to this.   For observation I described the risks which include but are not limited to silent renal damage, life-threatening infection, need for emergent surgery, failure to pass stone, and pain.   For ureteroscopy I described the risks which include heart attack, stroke, pulmonary embolus, death, bleeding, infection, damage to contiguous structures, positioning injury, ureteral stricture, ureteral avulsion, ureteral injury, need for ureteral stent, inability to perform ureteroscopy, need for an interval procedure, inability to clear stone burden, stent discomfort and pain.   For shockwave lithotripsy I described the risks which include arrhythmia, kidney contusion, kidney hemorrhage, need for transfusion, long-term risk of diabetes or hypertension, back discomfort, flank ecchymosis, flank abrasion, inability to break up stone, inability to pass stone fragments, Steinstrasse, infection associated with obstructing stones, need for different surgical procedure and possible need for repeat shockwave lithotripsy.   Patient would prefer shockwave  lithotripsy but ultimately final recommendation will be deferred to his primary urologist judgment. I will send a message to Dr Annabell Howells regarding today's visit and then have the pt contacted with those and scheduled appropriately. In the meantime he was given follow-up instructions for any new or worsening symptomology including increased LUTS, development of pain/burning with urination and gross hematuria as well as pain/discomfort suggestive of obstructive uropathy.

## 2023-04-06 DIAGNOSIS — N201 Calculus of ureter: Secondary | ICD-10-CM | POA: Diagnosis not present

## 2023-04-06 DIAGNOSIS — R8279 Other abnormal findings on microbiological examination of urine: Secondary | ICD-10-CM | POA: Diagnosis not present

## 2023-04-16 DIAGNOSIS — H527 Unspecified disorder of refraction: Secondary | ICD-10-CM | POA: Diagnosis not present

## 2023-05-07 DIAGNOSIS — N201 Calculus of ureter: Secondary | ICD-10-CM | POA: Diagnosis not present

## 2023-05-14 ENCOUNTER — Other Ambulatory Visit: Payer: Self-pay | Admitting: Urology

## 2023-06-07 NOTE — Progress Notes (Addendum)
 COVID Vaccine Completed: yes  Date of COVID positive in last 90 days: ni  PCP - Lequita Dye, MD Cardiologist - n/a  Chest x-ray - n/a EKG - 06/08/23 Epic/chart Stress Test - 40 years ago, ECHO - n/a Cardiac Cath - n/a Pacemaker/ICD device last checked: n/a Spinal Cord Stimulator:n/a  Bowel Prep - no  Sleep Study - n/a CPAP -   Fasting Blood Sugar - n/a Checks Blood Sugar _____ times a day  Last dose of GLP1 agonist-  N/A GLP1 instructions:  Hold 7 days before surgery    Last dose of SGLT-2 inhibitors-  N/A SGLT-2 instructions:  Hold 3 days before surgery    Blood Thinner Instructions:  Time Aspirin Instructions: ASA 81, hold 5-7 days Last Dose:  Activity level: Can go up a flight of stairs and perform activities of daily living without stopping and without symptoms of chest pain or shortness of breath.  Anesthesia review:   Patient denies shortness of breath, fever, cough and chest pain at PAT appointment  Patient verbalized understanding of instructions that were given to them at the PAT appointment. Patient was also instructed that they will need to review over the PAT instructions again at home before surgery.

## 2023-06-08 ENCOUNTER — Encounter (HOSPITAL_COMMUNITY)
Admission: RE | Admit: 2023-06-08 | Discharge: 2023-06-08 | Disposition: A | Discharge status: Home / Self Care | Payer: Medicare Other | Source: Ambulatory Visit | Attending: Urology | Admitting: Urology

## 2023-06-08 ENCOUNTER — Other Ambulatory Visit: Payer: Self-pay

## 2023-06-08 ENCOUNTER — Encounter (HOSPITAL_COMMUNITY): Payer: Self-pay

## 2023-06-08 VITALS — BP 136/77 | HR 66 | Temp 97.9°F | Resp 18 | Ht 66.0 in | Wt 175.0 lb

## 2023-06-08 DIAGNOSIS — Z01818 Encounter for other preprocedural examination: Secondary | ICD-10-CM | POA: Diagnosis not present

## 2023-06-08 DIAGNOSIS — I10 Essential (primary) hypertension: Secondary | ICD-10-CM | POA: Insufficient documentation

## 2023-06-08 DIAGNOSIS — R9431 Abnormal electrocardiogram [ECG] [EKG]: Secondary | ICD-10-CM | POA: Diagnosis not present

## 2023-06-08 DIAGNOSIS — Z01810 Encounter for preprocedural cardiovascular examination: Secondary | ICD-10-CM | POA: Diagnosis present

## 2023-06-08 DIAGNOSIS — Z01812 Encounter for preprocedural laboratory examination: Secondary | ICD-10-CM | POA: Diagnosis present

## 2023-06-08 HISTORY — DX: Malignant (primary) neoplasm, unspecified: C80.1

## 2023-06-08 HISTORY — DX: Personal history of urinary calculi: Z87.442

## 2023-06-08 LAB — BASIC METABOLIC PANEL
Anion gap: 9 (ref 5–15)
BUN: 16 mg/dL (ref 8–23)
CO2: 24 mmol/L (ref 22–32)
Calcium: 9.2 mg/dL (ref 8.9–10.3)
Chloride: 106 mmol/L (ref 98–111)
Creatinine, Ser: 0.8 mg/dL (ref 0.61–1.24)
GFR, Estimated: >60 mL/min (ref >60)
Glucose, Bld: 100 mg/dL — ABNORMAL HIGH (ref 70–99)
Potassium: 4.3 mmol/L (ref 3.5–5.1)
Sodium: 139 mmol/L (ref 135–145)

## 2023-06-08 LAB — CBC
HCT: 44.3 % (ref 39.0–52.0)
Hemoglobin: 14.6 g/dL (ref 13.0–17.0)
MCH: 29.7 pg (ref 26.0–34.0)
MCHC: 33 g/dL (ref 30.0–36.0)
MCV: 90 fL (ref 80.0–100.0)
Platelets: 241 10*3/uL (ref 150–400)
RBC: 4.92 MIL/uL (ref 4.22–5.81)
RDW: 12.7 % (ref 11.5–15.5)
WBC: 7.4 10*3/uL (ref 4.0–10.5)
nRBC: 0 % (ref 0.0–0.2)

## 2023-06-08 NOTE — Patient Instructions (Addendum)
 SURGICAL WAITING ROOM VISITATION  Patients having surgery or a procedure may have no more than 2 support people in the waiting area - these visitors may rotate.    Children under the age of 63 must have an adult with them who is not the patient.  Due to an increase in RSV and influenza rates and associated hospitalizations, children ages 61 and under may not visit patients in [facility] hospitals.  Visitors with respiratory illnesses are discouraged from visiting and should remain at home.  If the patient needs to stay at the hospital during part of their recovery, the visitor guidelines for inpatient rooms apply. Pre-op nurse will coordinate an appropriate time for 1 support person to accompany patient in pre-op.  This support person may not rotate.    Please refer to the [facility] website for the visitor guidelines for Inpatients (after your surgery is over and you are in a regular room).    Your procedure is scheduled on: 06/22/23   Report to [facility] Main Entrance    Report to admitting at 6:45 AM   Call this number if you have problems the morning of surgery [phone]   Do not eat food or drink liquids :After Midnight.          If you have questions, please contact your surgeon's office.   FOLLOW BOWEL PREP AND ANY ADDITIONAL PRE OP INSTRUCTIONS YOU RECEIVED FROM YOUR SURGEON'S OFFICE!!!     Oral Hygiene is also important to reduce your risk of infection.                                    Remember - BRUSH YOUR TEETH THE MORNING OF SURGERY WITH YOUR REGULAR TOOTHPASTE  DENTURES WILL BE REMOVED PRIOR TO SURGERY PLEASE DO NOT APPLY Poly grip OR ADHESIVES!!!   Stop all vitamins and herbal supplements 7 days before surgery.   Take these medicines the morning of surgery with A SIP OF WATER : None                               You may not have any metal on your body including jewelry, and body piercing             Do not wear lotions, powders, cologne, or  deodorant              Men may shave face and neck.   Do not bring valuables to the hospital. [facility] IS NOT             RESPONSIBLE   FOR VALUABLES.   Contacts, glasses, dentures or bridgework may not be worn into surgery.  DO NOT BRING YOUR HOME MEDICATIONS TO THE HOSPITAL. PHARMACY WILL DISPENSE MEDICATIONS LISTED ON YOUR MEDICATION LIST TO YOU DURING YOUR ADMISSION IN THE HOSPITAL!    Patients discharged on the day of surgery will not be allowed to drive home.  Someone NEEDS to stay with you for the first 24 hours after anesthesia.              Please read over the following fact sheets you were given: IF YOU HAVE QUESTIONS ABOUT YOUR PRE-OP INSTRUCTIONS PLEASE CALL [phone]    If you received a COVID test during your pre-op visit  it is requested that you wear a mask when out in public, stay  away from anyone that may not be feeling well and notify your surgeon if you develop symptoms. If you test positive for Covid or have been in contact with anyone that has tested positive in the last 10 days please notify you surgeon.    [place] - Preparing for Surgery Before surgery, you can play an important role.  Because skin is not sterile, your skin needs to be as free of germs as possible.  You can reduce the number of germs on your skin by washing with CHG (chlorahexidine gluconate) soap before surgery.  CHG is an antiseptic cleaner which kills germs and bonds with the skin to continue killing germs even after washing. Please DO NOT use if you have an allergy to CHG or antibacterial soaps.  If your skin becomes reddened/irritated stop using the CHG and inform your nurse when you arrive at Short Stay. Do not shave (including legs and underarms) for at least 48 hours prior to the first CHG shower.  You may shave your face/neck.  Please follow these instructions carefully:  1.  Shower with CHG Soap the night before surgery and the  morning of surgery.  2.  If you choose to  wash your hair, wash your hair first as usual with your normal  shampoo.  3.  After you shampoo, rinse your hair and body thoroughly to remove the shampoo.                             4.  Use CHG as you would any other liquid soap.  You can apply chg directly to the skin and wash.  Gently with a scrungie or clean washcloth.  5.  Apply the CHG Soap to your body ONLY FROM THE NECK DOWN.   Do   not use on face/ open                           Wound or open sores. Avoid contact with eyes, ears mouth and   genitals (private parts).                       Wash face,  Genitals (private parts) with your normal soap.             6.  Wash thoroughly, paying special attention to the area where your    surgery  will be performed.  7.  Thoroughly rinse your body with warm water  from the neck down.  8.  DO NOT shower/wash with your normal soap after using and rinsing off the CHG Soap.                9.  Pat yourself dry with a clean towel.            10.  Wear clean pajamas.            11.  Place clean sheets on your bed the night of your first shower and do not  sleep with pets. Day of Surgery : Do not apply any lotions/deodorants the morning of surgery.  Please wear clean clothes to the hospital/surgery center.  FAILURE TO FOLLOW THESE INSTRUCTIONS MAY RESULT IN THE CANCELLATION OF YOUR SURGERY  PATIENT SIGNATURE_________________________________  NURSE SIGNATURE__________________________________  ________________________________________________________________________

## 2023-06-21 NOTE — Progress Notes (Signed)
 Pt aware to arrive at [facility] admitting at 0515 on Friday 06/22/2023 for scheduled surgical procedure. No food after midnight; clear liquids from midnight till 0430 then nothing by mouth.

## 2023-06-21 NOTE — H&P (Signed)
 03/12/2023: CT imaging obtained at time of last exam as part of ongoing bladder cancer surveillance showed a nonobstructing distal right ureteral calculi. The patient has been grossly asymptomatic in regards to this without noted changes in baseline urinary frequency/urgency, denies changes in force of stream. He has had no correlating right-sided pain or discomfort suggestive of obstructive uropathy. Denies any dysuria or gross hematuria.   04/06/23: Patient is here today following ESWL on 11/18 for a previously diagnosed nonobstructing right distal ureteral stone. KUB today with minimal change in stone size and location from pre-procedure imaging. He has not noticed any interval stone passage. He remains asymptomatic without changes in baseline frequency/urgency and force of stream. He denies gross hematuria, dysuria, fever/chills, flank pain.   05/07/23: Patient returns today for follow-up on right ureteral calculi after undergoing ESWL on 11/18. At his follow-up 2 weeks after procedure there was no notable stone passage or fragmentation seen on KUB but patient elected to continue observation before undergoing URS in the new year. Today he continues to deny passage of stone material. Denies gross hematuria, dysuria, flank pain, fever/chills, nausea/vomiting.     ALLERGIES: No Allergies    MEDICATIONS: Aspirin Ec 81 mg tablet, delayed release Oral  Fish Oil CAPS Oral  Icaps Areds  Lisinopril 10 mg tablet Oral  Lovastatin 40 mg tablet  Multivitamin     GU PSH: Bladder Instill AntiCA Agent - 2021, 2021, 2021, 2020, 2020, 2020, 2020, 2020, 2020, 2020, 2020, 2020, 2020, 2020, 2020, 2020, 2020 Cysto Fulgurate < 0.5 cm - 2020 Cysto Remove Stent FB Sim - 2019 Cystoscopy - 01/29/2023, 01/31/2022, 01/28/2021, 2022, 2021, 2021, 2021, 2020, 2020, 2020, 2019 Cystoscopy Insert Stent, Right - 2019 Cystoscopy TURBT <2 cm - 2020, 2020 Cystoscopy TURBT 2-5 cm - 2019 Locm 300-399Mg /Ml Iodine,1Ml - 01/17/2023,  2019 Vasectomy - 1982       PSH Notes: Jaw Surgery   NON-GU PSH: Hernia Repair Visit Complexity (formerly GPC1X) - 04/06/2023, 03/12/2023     GU PMH: Ureteral calculus - 04/06/2023, - 03/12/2023, He has a 4x63mm right distal stone without obstruction. I will contact him to discuss this since I failed to mention it at the visit. , - 01/29/2023 History of bladder cancer - 03/12/2023, No recurrent tumors seen. cysto in a year. , - 01/29/2023, No recurrent tumors seen. He will return in a year with a CT hematuria study for cystoscopy. , - 01/31/2022, No recurrent tumors seen. he will return in a year for cystoscopy. , - 01/28/2021, No recurrences seen. f/u in 6 months for cystoscopy. , - 2022, He has no recurrent tumors. I will not be able to give him maintenance BCG with the shortage. He will return in 3 months for cystoscopy. , - 2021, He has no recurrent disease. I will have him return in 6 months for cystoscopy. , - 2019 Microscopic hematuria (Stable) - 03/12/2023, (Stable), I will get culture today., - 2019, He has significant microhematuria and needs eval with CT and possible cystoscopy. , - 2019 Bladder Cancer overlapping sites - 01/17/2023, He has multifocal Ta tumors and is intermediate risk. He is going to need BCG induction and 1 year of maintenance. I have reviewed the side effects and risks of the BCG treatment. , - 2020 BPH w/o LUTS, He has trilobar hyperplasia with obstruction but no LUTS. - 2020 Bladder Cancer Dome, He has multiple small recurrent tumors. I will get him set up for cystoscopy, bil RTG's, TURBT and instillation of gemcitabine. I reviewed the  risks of bleeding, infection, bladder injury, need for a stent or secondary procedures, chemical cystitis, thrombotic events and anesthetic complications. July 18, 2018 Bladder Cancer Lateral - 07-18-18 Bladder Cancer Trigone , He had low grade superficial disease. I will have him return in 6 weeks for a renal US and in 3 months for cystoscopy. 07/18/17,  He has an 11mm right trigonal lesion that is worrisome for UCCa. I am going to get him set up for cystoscopy with TURBT, possible right stent and possible epirubicin instillation. I reviewed the risks of bleeding, infection, bladder injury, need for a stent and secondary procedures, chemical cystitis, thrombotic events and anesthetic complications. , Jul 18, 2017 Renal calculus - 18-Jul-2017 BPH w/LUTS, Benign prostatic hyperplasia with urinary obstruction - Jul 18, 2014 Dysuria, Dysuria - 07/18/14 Nocturia, Nocturia - 07/18/2014 Unil Inguinal Hernia W/O obst or gang,non-recurrent, Inguinal hernia, right - 18-Jul-2014 Urinary Retention, Unspec, Urinary retention - 18-Jul-2013 Gross hematuria, Gross hematuria - 2015    NON-GU PMH: Solitary pulmonary nodule, He will need a Chest CT for further evaluation. I will discuss that with him at his post op visit. 07/18/2017 Encounter for general adult medical examination without abnormal findings, Encounter for preventive health examination - 07-18-2014 Personal history of other diseases of the circulatory system, History of hypertension - 18-Jul-2013 Personal history of other endocrine, nutritional and metabolic disease, History of hypercholesterolemia - 2013/07/18    FAMILY HISTORY: Death of family member - Runs In Family   SOCIAL HISTORY: Marital Status: Married Preferred Language: English; Race: White Current Smoking Status: Patient has never smoked.   Tobacco Use Assessment Completed: Used Tobacco in last 30 days? Drinks 3 caffeinated drinks per day.     Notes: Retired, Divorced, No alcohol use, Never a smoker, Daily caffeine consumption, 2-3 servings a day, One child   REVIEW OF SYSTEMS:    GU Review Male:   Patient denies frequent urination, hard to postpone urination, burning/ pain with urination, get up at night to urinate, leakage of urine, stream starts and stops, trouble starting your stream, have to strain to urinate , erection problems, and penile pain.  Gastrointestinal (Upper):   Patient denies  nausea, vomiting, and indigestion/ heartburn.  Gastrointestinal (Lower):   Patient denies diarrhea and constipation.  Constitutional:   Patient denies fever, night sweats, weight loss, and fatigue.  Skin:   Patient denies skin rash/ lesion and itching.  Eyes:   Patient denies blurred vision and double vision.  Ears/ Nose/ Throat:   Patient denies sore throat and sinus problems.  Hematologic/Lymphatic:   Patient denies easy bruising and swollen glands.  Cardiovascular:   Patient denies leg swelling and chest pains.  Respiratory:   Patient denies cough and shortness of breath.  Endocrine:   Patient denies excessive thirst.  Musculoskeletal:   Patient denies back pain and joint pain.  Neurological:   Patient denies headaches and dizziness.  Psychologic:   Patient denies depression and anxiety.   VITAL SIGNS:      05/07/2023 02:55 PM  BP 152/82 mmHg  Pulse 62 /min  Temperature 97.8 F / 36.5 C   MULTI-SYSTEM PHYSICAL EXAMINATION:    Constitutional: Well-nourished. No physical deformities. Normally developed. Good grooming.  Neck: Neck symmetrical, not swollen. Normal tracheal position.  Respiratory: No labored breathing, no use of accessory muscles.   Cardiovascular: Normal temperature, normal extremity pulses, no swelling, no varicosities.  Neurologic / Psychiatric: Oriented to time, oriented to place, oriented to person. No depression, no anxiety, no agitation.  Gastrointestinal: No mass,  no tenderness, no rigidity, non obese abdomen.  Musculoskeletal: Normal gait and station of head and neck.     Complexity of Data:  Source Of History:  Patient, Medical Record Summary  Records Review:   Previous Doctor Records, Previous Hospital Records, Previous Patient Records  Urine Test Review:   Urinalysis  X-Ray Review: KUB: Reviewed Films. Reviewed Report.     09/24/14  PSA  Total PSA 2.16     05/07/23  Urinalysis  Urine Appearance Clear   Urine Color Yellow   Urine Glucose Neg mg/dL   Urine Bilirubin Neg mg/dL  Urine Ketones Neg mg/dL  Urine Specific Gravity 1.025   Urine Blood Neg ery/uL  Urine pH 5.5   Urine Protein 2+ mg/dL  Urine Urobilinogen 0.2 mg/dL  Urine Nitrites Neg   Urine Leukocyte Esterase Neg leu/uL  Urine WBC/hpf 0 - 5/hpf   Urine RBC/hpf 0 - 2/hpf   Urine Epithelial Cells 0 - 5/hpf   Urine Bacteria Rare (0-9/hpf)   Urine Mucous Not Present   Urine Yeast NS (Not Seen)   Urine Trichomonas Not Present   Urine Cystals NS (Not Seen)   Urine Casts NS (Not Seen)   Urine Sperm Not Present    PROCEDURES:         KUB - 16109  A single view of the abdomen is obtained. Bilateral renal shadows poorly visualized due to prominent overlying bowel gas and x-ray exposure. No obvious opacities seen within renal shadows. Left ureteral tract grossly clear. Right ureteral tract continues with 7 mm stone at the UVJ remains grossly unchanged from prior KUB.      Patient confirmed No Neulasta OnPro Device.           Urinalysis w/Scope Dipstick Dipstick Cont'd Micro  Color: Yellow Bilirubin: Neg mg/dL WBC/hpf: 0 - 5/hpf  Appearance: Clear Ketones: Neg mg/dL RBC/hpf: 0 - 2/hpf  Specific Gravity: 1.025 Blood: Neg ery/uL Bacteria: Rare (0-9/hpf)  pH: 5.5 Protein: 2+ mg/dL Cystals: NS (Not Seen)  Glucose: Neg mg/dL Urobilinogen: 0.2 mg/dL Casts: NS (Not Seen)    Nitrites: Neg Trichomonas: Not Present    Leukocyte Esterase: Neg leu/uL Mucous: Not Present      Epithelial Cells: 0 - 5/hpf      Yeast: NS (Not Seen)      Sperm: Not Present    ASSESSMENT:      ICD-10 Details  1 GU:   Ureteral calculus - N20.1 Acute, Threat to Bodily Function   PLAN:           Orders Labs Urine Culture          Schedule Return Visit/Planned Activity: Next Available Appointment - Schedule Surgery          Document Letter(s):  Created for Patient: Clinical Summary         Notes:   UA today clear. Precautionary pre-op culture sent.   We discussed the management of  renal/ureteral stones. These options include observation, ureteroscopy, and shockwave lithotripsy. We discussed which options are relevant to these particular stones. We discussed the natural history of stones as well as the complications of untreated stones and the impact on quality of life without treatment as well as with each of the above listed treatments. We also discussed the efficacy of each treatment in its ability to clear the stone burden. With any of these management options I discussed the signs and symptoms of infection and the need for emergent treatment should these be experienced. For each option  we discussed the ability of each procedure to clear the patient of their stone burden.   For observation I described the risks which include but are not limited to silent renal damage, life-threatening infection, need for emergent surgery, failure to pass stone, and pain.   For ureteroscopy I described the risks which include heart attack, stroke, pulmonary embolus, death, bleeding, infection, damage to contiguous structures, positioning injury, ureteral stricture, ureteral avulsion, ureteral injury, need for ureteral stent, inability to perform ureteroscopy, need for an interval procedure, inability to clear stone burden, stent discomfort and pain.   For shockwave lithotripsy I described the risks which include arrhythmia, kidney contusion, kidney hemorrhage, need for transfusion, pain, inability to break up stone, inability to pass stone fragments, Steinstrasse, infection associated with obstructing stones, need for different surgical procedure, need for repeat shockwave lithotripsy.   He elected to proceed with ureteroscopy. Green sheet submitted.

## 2023-06-21 NOTE — Anesthesia Preprocedure Evaluation (Signed)
 Anesthesia Evaluation  Patient identified by MRN, date of birth, ID band Patient awake    Reviewed: Allergy & Precautions, H&P , NPO status , Patient's Chart, lab work & pertinent test results, reviewed documented beta blocker date and time   Airway Mallampati: I  TM Distance: >3 FB Neck ROM: full    Dental no notable dental hx. (+) Partial Lower, Missing, Caps, Dental Advisory Given,    Pulmonary neg pulmonary ROS   Pulmonary exam normal breath sounds clear to auscultation       Cardiovascular Exercise Tolerance: Good hypertension, Pt. on medications  Rhythm:regular Rate:Normal     Neuro/Psych negative neurological ROS  negative psych ROS   GI/Hepatic Neg liver ROS, hiatal hernia,,,  Endo/Other  negative endocrine ROS    Renal/GU Kidney stones Bladder dysfunction  Hx bladder tumor s/p TURBT 07/2017    Musculoskeletal negative musculoskeletal ROS (+)    Abdominal Normal abdominal exam  (+) + obese  Peds  Hematology negative hematology ROS (+)   Anesthesia Other Findings   Reproductive/Obstetrics negative OB ROS                              Anesthesia Physical Anesthesia Plan  ASA: 3  Anesthesia Plan: General   Post-op Pain Management:    Induction: Intravenous  PONV Risk Score and Plan: 2 and Ondansetron, Treatment may vary due to age or medical condition and Dexamethasone  Airway Management Planned: LMA  Additional Equipment: None  Intra-op Plan:   Post-operative Plan: Extubation in OR  Informed Consent: I have reviewed the patients History and Physical, chart, labs and discussed the procedure including the risks, benefits and alternatives for the proposed anesthesia with the patient or authorized representative who has indicated his/her understanding and acceptance.     Dental Advisory Given  Plan Discussed with: CRNA  Anesthesia Plan Comments: ( )          Anesthesia Quick Evaluation

## 2023-06-22 ENCOUNTER — Ambulatory Visit (HOSPITAL_COMMUNITY): Payer: Medicare Other

## 2023-06-22 ENCOUNTER — Ambulatory Visit (HOSPITAL_COMMUNITY)
Admission: RE | Admit: 2023-06-22 | Discharge: 2023-06-22 | Disposition: A | Discharge status: Home / Self Care | Payer: Medicare Other | Attending: Urology | Admitting: Urology

## 2023-06-22 ENCOUNTER — Encounter (HOSPITAL_COMMUNITY)
Admission: RE | Disposition: A | Discharge status: Home / Self Care | Payer: Self-pay | Source: Home / Self Care | Attending: Urology

## 2023-06-22 ENCOUNTER — Encounter (HOSPITAL_COMMUNITY): Payer: Self-pay | Admitting: Urology

## 2023-06-22 ENCOUNTER — Ambulatory Visit (HOSPITAL_BASED_OUTPATIENT_CLINIC_OR_DEPARTMENT_OTHER): Payer: Medicare Other

## 2023-06-22 ENCOUNTER — Other Ambulatory Visit: Payer: Self-pay

## 2023-06-22 DIAGNOSIS — N201 Calculus of ureter: Secondary | ICD-10-CM | POA: Insufficient documentation

## 2023-06-22 DIAGNOSIS — I10 Essential (primary) hypertension: Secondary | ICD-10-CM | POA: Diagnosis not present

## 2023-06-22 DIAGNOSIS — E785 Hyperlipidemia, unspecified: Secondary | ICD-10-CM | POA: Diagnosis not present

## 2023-06-22 HISTORY — PX: CYSTOSCOPY/URETEROSCOPY/HOLMIUM LASER/STENT PLACEMENT: SHX6546

## 2023-06-22 SURGERY — CYSTOSCOPY/URETEROSCOPY/HOLMIUM LASER/STENT PLACEMENT
Anesthesia: General | Laterality: Right

## 2023-06-22 MED ORDER — DEXAMETHASONE SODIUM PHOSPHATE 10 MG/ML IJ SOLN
INTRAMUSCULAR | Status: DC | PRN
  Administered 2023-06-22: 5 mg via INTRAVENOUS

## 2023-06-22 MED ORDER — ORAL CARE MOUTH RINSE: 15.0000 mL | Freq: Once | OROMUCOSAL | Status: AC

## 2023-06-22 MED ORDER — PROPOFOL 10 MG/ML IV BOLUS
INTRAVENOUS | Status: AC
  Filled 2023-06-22: qty 20

## 2023-06-22 MED ORDER — FENTANYL CITRATE (PF) 100 MCG/2ML IJ SOLN
INTRAMUSCULAR | Status: AC
Start: 2023-06-22 — End: ?
  Filled 2023-06-22: qty 2

## 2023-06-22 MED ORDER — ACETAMINOPHEN 10 MG/ML IV SOLN: 1000.0000 mg | Freq: Once | INTRAVENOUS | Status: DC | PRN

## 2023-06-22 MED ORDER — NEOSTIGMINE METHYLSULFATE 3 MG/3ML IV SOSY
PREFILLED_SYRINGE | INTRAVENOUS | Status: AC
  Filled 2023-06-22: qty 3

## 2023-06-22 MED ORDER — LACTATED RINGERS IV SOLN: INTRAVENOUS | Status: DC

## 2023-06-22 MED ORDER — FENTANYL CITRATE (PF) 100 MCG/2ML IJ SOLN
INTRAMUSCULAR | Status: DC | PRN
  Administered 2023-06-22 (×2): 25 ug via INTRAVENOUS

## 2023-06-22 MED ORDER — CEFAZOLIN SODIUM-DEXTROSE 2-4 GM/100ML-% IV SOLN
2.0000 g | INTRAVENOUS | Status: AC
  Administered 2023-06-22: 2 g via INTRAVENOUS
  Filled 2023-06-22: qty 100

## 2023-06-22 MED ORDER — OXYCODONE HCL 5 MG PO TABS: 5.0000 mg | ORAL_TABLET | Freq: Once | ORAL | Status: DC | PRN

## 2023-06-22 MED ORDER — SODIUM CHLORIDE 0.9% FLUSH
3.0000 mL | Freq: Two times a day (BID) | INTRAVENOUS | Status: DC
Start: 2023-06-22 — End: 2023-06-22

## 2023-06-22 MED ORDER — TRAMADOL HCL 50 MG PO TABS: 50.0000 mg | ORAL_TABLET | Freq: Four times a day (QID) | ORAL | 0 refills | Status: AC | PRN

## 2023-06-22 MED ORDER — PROPOFOL 10 MG/ML IV BOLUS
INTRAVENOUS | Status: DC | PRN
  Administered 2023-06-22: 50 mg via INTRAVENOUS
  Administered 2023-06-22: 100 mg via INTRAVENOUS

## 2023-06-22 MED ORDER — ONDANSETRON HCL 4 MG/2ML IJ SOLN
INTRAMUSCULAR | Status: DC | PRN
  Administered 2023-06-22: 4 mg via INTRAVENOUS

## 2023-06-22 MED ORDER — GLYCOPYRROLATE 0.2 MG/ML IJ SOLN
INTRAMUSCULAR | Status: AC
  Filled 2023-06-22: qty 1

## 2023-06-22 MED ORDER — DROPERIDOL 2.5 MG/ML IJ SOLN: 0.6250 mg | Freq: Once | INTRAMUSCULAR | Status: DC | PRN

## 2023-06-22 MED ORDER — LACTATED RINGERS IV SOLN: INTRAVENOUS | Status: DC | PRN

## 2023-06-22 MED ORDER — SODIUM CHLORIDE 0.9 % IR SOLN
Status: DC | PRN
  Administered 2023-06-22: 3000 mL

## 2023-06-22 MED ORDER — OXYCODONE HCL 5 MG/5ML PO SOLN: 5.0000 mg | Freq: Once | ORAL | Status: DC | PRN

## 2023-06-22 MED ORDER — FENTANYL CITRATE PF 50 MCG/ML IJ SOSY: 25.0000 ug | PREFILLED_SYRINGE | INTRAMUSCULAR | Status: DC | PRN

## 2023-06-22 MED ORDER — IOHEXOL 300 MG/ML  SOLN
INTRAMUSCULAR | Status: DC | PRN
  Administered 2023-06-22: 5 mL

## 2023-06-22 MED ORDER — CHLORHEXIDINE GLUCONATE 0.12 % MT SOLN
15.0000 mL | Freq: Once | OROMUCOSAL | Status: AC
  Administered 2023-06-22: 15 mL via OROMUCOSAL

## 2023-06-22 MED ORDER — LIDOCAINE HCL (PF) 2 % IJ SOLN
INTRAMUSCULAR | Status: AC
  Filled 2023-06-22: qty 5

## 2023-06-22 MED ORDER — ACETAMINOPHEN 500 MG PO TABS
1000.0000 mg | ORAL_TABLET | Freq: Once | ORAL | Status: AC
  Administered 2023-06-22: 1000 mg via ORAL
  Filled 2023-06-22: qty 2

## 2023-06-22 MED ORDER — LIDOCAINE HCL (CARDIAC) PF 100 MG/5ML IV SOSY
PREFILLED_SYRINGE | INTRAVENOUS | Status: DC | PRN
  Administered 2023-06-22: 100 mg via INTRATRACHEAL

## 2023-06-22 SURGICAL SUPPLY — 21 items
BAG URO CATCHER STRL LF (MISCELLANEOUS) ×1 IMPLANT
BASKET STONE NCOMPASS (UROLOGICAL SUPPLIES) IMPLANT
CATH URETERAL DUAL LUMEN 10F (MISCELLANEOUS) IMPLANT
CATH URETL OPEN 5X70 (CATHETERS) IMPLANT
CLOTH BEACON ORANGE TIMEOUT ST (SAFETY) ×1 IMPLANT
EXTRACTOR STONE NITINOL NGAGE (UROLOGICAL SUPPLIES) IMPLANT
GLOVE SURG SS PI 8.0 STRL IVOR (GLOVE) ×1 IMPLANT
GOWN SPEC L4 XLG W/TWL (GOWN DISPOSABLE) ×1 IMPLANT
GUIDEWIRE STR DUAL SENSOR (WIRE) ×1 IMPLANT
IV NS IRRIG 3000ML ARTHROMATIC (IV SOLUTION) ×1 IMPLANT
KIT TURNOVER KIT A (KITS) IMPLANT
LASER FIB FLEXIVA PULSE ID 365 (Laser) IMPLANT
LASER FIB FLEXIVA PULSE ID 550 (Laser) IMPLANT
LASER FIB FLEXIVA PULSE ID 910 (Laser) IMPLANT
MANIFOLD NEPTUNE II (INSTRUMENTS) ×1 IMPLANT
PACK CYSTO (CUSTOM PROCEDURE TRAY) ×1 IMPLANT
SHEATH NAVIGATOR HD 11/13X36 (SHEATH) IMPLANT
STENT URET 6FRX24 CONTOUR (STENTS) IMPLANT
TRACTIP FLEXIVA PULS ID 200XHI (Laser) IMPLANT
TUBING CONNECTING 10 (TUBING) ×1 IMPLANT
TUBING UROLOGY SET (TUBING) ×1 IMPLANT

## 2023-06-22 NOTE — Op Note (Signed)
 Procedure: 1.  Cystoscopy with right retrograde pyelogram and interpretation. 2.  Right ureteroscopy with holmium laser application, stone extraction and insertion of double-J stent. 3.  Application of fluoroscopy.  Preop diagnosis: 7 mm right UVJ stone.  Postop diagnosis: Same.  Surgeon: Dr. Bjorn Pippin.  Anesthesia: General.  Specimen: Stone fragments, given to the family.  Drain: 6 [place] by 24 cm right contour double-J stent with tether.  EBL: None.  Complications: None.  Indications: The patient is an 88 year old male with a history of a 7 mm right UVJ stone that failed to fragment with lithotripsy.  He returns for ureteroscopy.  Procedure: She taken operating room was given antibiotic.  A general anesthetic was induced.  He was placed in lithotomy position and fitted with PAS hose.  His perineum and genitalia were prepped Betadine solution and draped in usual sterile fashion.  Cystoscopy was performed using the 21 [place] scope with a 30 degree lens.  Examination really normal urethra.  The external sphincter was intact.  The prostatic urethra short with trilobar hyperplasia with a small middle lobe.  Examination of bladder revealed moderate trabeculation without tumors, stones or inflammation.  The left ureteral orifice was unremarkable.  The right ureteral orifice was slightly lateral and an old resection scar from his history of bladder cancer but no tumors were noted.  After completion of the diagnostic cystoscopy, a right retrograde pyelogram was performed with a 5 [place] open-ended catheter and Omnipaque.  The right retrograde pyelogram revealed a normal caliber distal ureter with a filling defect consistent with a stone and mild proximal dilation.  After completion of the retrograde the sensor wire was advanced the kidney under fluoroscopic guidance and the cystoscope was removed.  The 22 French inner core of a 36 cm digital access sheath was passed without difficulty up the  ureter to dilate the intramural ureter.  This was followed by the assembled sheath without difficulty.  A 6.5 French semirigid ureteroscope was then advanced alongside the wire and the stone was visualized.  It had a dark all of color consistent with calcium oxalate monohydrate stone.  A 365 m laser fiber was then passed and the Moses laser was set on the dusting setting.  The stone was broken into manageable fragments which were then removed with the engage basket.  Once the fragments had been removed, the cystoscope was replaced over the wire and a 6 [place] by 24 cm contour double-J stent with tether was advanced the kidney under fluoroscopic guidance.  The wire was removed, a good coil in the kidney and a good coil in the bladder.  The bladder was drained and the cystoscope was removed leaving the stent string exiting urethra.  The string was secured to the patient's penis.  He was taken down from lithotomy position, his anesthetic was reversed and he was moved recovery in stable condition.  There were no complications.

## 2023-06-22 NOTE — Discharge Instructions (Addendum)
 You may remove the stent by pulling the attached string on Monday morning.  If you don't feel you can do that, please call the office to have it removed.  Please bring the stone fragments to the office for analysis.

## 2023-06-22 NOTE — Anesthesia Procedure Notes (Signed)
 Procedure Name: LMA Insertion Date/Time: 06/22/2023 8:08 AM  Performed by: Deri Fuelling, CRNAPre-anesthesia Checklist: Patient identified, Emergency Drugs available, Suction available and Patient being monitored Patient Re-evaluated:Patient Re-evaluated prior to induction Oxygen Delivery Method: Circle system utilized Preoxygenation: Pre-oxygenation with 100% oxygen Induction Type: IV induction Ventilation: Mask ventilation without difficulty LMA: LMA flexible inserted LMA Size: 4.0 Tube type: Oral Number of attempts: 1 Airway Equipment and Method: Stylet and Oral airway Placement Confirmation: positive ETCO2 and breath sounds checked- equal and bilateral Tube secured with: Tape Dental Injury: Teeth and Oropharynx as per pre-operative assessment

## 2023-06-22 NOTE — Transfer of Care (Signed)
 Immediate Anesthesia Transfer of Care Note  Patient: Zachary Ingram  Procedure(s) Performed: CYSTOSCOPY/RIGHT URETEROSCOPY/HOLMIUM LASER/STENT PLACEMENT AND RIGHT RETROGRADE PYELOGRAM (Right)  Patient Location: PACU  Anesthesia Type:General  Level of Consciousness: drowsy  Airway & Oxygen Therapy: Patient Spontanous Breathing and Patient connected to face mask oxygen  Post-op Assessment: Report given to RN, Post -op Vital signs reviewed and stable, and Patient moving all extremities X 4  Post vital signs: Reviewed and stable  Last Vitals:  Vitals Value Taken Time  BP 144/84   Temp    Pulse 54 06/22/23 0843  Resp 12   SpO2 100 % 06/22/23 0843  Vitals shown include unfiled device data.  Last Pain:  Vitals:   06/22/23 0622  TempSrc:   PainSc: 0-No pain      Patients Stated Pain Goal: 4 (06/22/23 0622)  Complications: No notable events documented.

## 2023-06-22 NOTE — Anesthesia Postprocedure Evaluation (Signed)
 Anesthesia Post Note  Patient: Zachary Ingram  Procedure(s) Performed: CYSTOSCOPY/RIGHT URETEROSCOPY/HOLMIUM LASER/STENT PLACEMENT AND RIGHT RETROGRADE PYELOGRAM (Right)     Patient location during evaluation: PACU Anesthesia Type: General Level of consciousness: awake and alert Pain management: pain level controlled Vital Signs Assessment: post-procedure vital signs reviewed and stable Respiratory status: spontaneous breathing, nonlabored ventilation, respiratory function stable and patient connected to nasal cannula oxygen Cardiovascular status: blood pressure returned to baseline and stable Postop Assessment: no apparent nausea or vomiting Anesthetic complications: no   No notable events documented.  Last Vitals:  Vitals:   06/22/23 0900 06/22/23 0937  BP: (!) 149/79 (!) 151/70  Pulse: (!) 50 (!) 46  Resp: 18   Temp:  36.6 C  SpO2: 98% 97%    Last Pain:  Vitals:   06/22/23 0937  TempSrc: Oral  PainSc:                  [name]

## 2023-06-22 NOTE — Interval H&P Note (Signed)
 History and Physical Interval Note:  06/22/2023 7:28 AM  Zachary Ingram  has presented today for surgery, with the diagnosis of RIGHT URETERAL STONE.  The various methods of treatment have been discussed with the patient and family. After consideration of risks, benefits and other options for treatment, the patient has consented to  Procedure(s) with comments: CYSTOSCOPY/RIGHT URETEROSCOPY/HOLMIUM LASER/STENT PLACEMENT AND RIGHT RETROGRADE PYELOGRAM (Right) - 75 MINUTE CASE as a surgical intervention.  The patient's history has been reviewed, patient examined, no change in status, stable for surgery.  I have reviewed the patient's chart and labs.  Questions were answered to the patient's satisfaction.     Bjorn Pippin

## 2023-06-23 ENCOUNTER — Encounter (HOSPITAL_COMMUNITY): Payer: Self-pay | Admitting: Urology

## 2023-06-29 DIAGNOSIS — N201 Calculus of ureter: Secondary | ICD-10-CM | POA: Diagnosis not present

## 2023-08-20 DIAGNOSIS — N201 Calculus of ureter: Secondary | ICD-10-CM | POA: Diagnosis not present

## 2023-08-20 DIAGNOSIS — Z8551 Personal history of malignant neoplasm of bladder: Secondary | ICD-10-CM | POA: Diagnosis not present

## 2023-09-18 DIAGNOSIS — E782 Mixed hyperlipidemia: Secondary | ICD-10-CM | POA: Diagnosis not present

## 2023-09-18 DIAGNOSIS — C439 Malignant melanoma of skin, unspecified: Secondary | ICD-10-CM | POA: Diagnosis not present

## 2023-09-18 DIAGNOSIS — Z0000 Encounter for general adult medical examination without abnormal findings: Secondary | ICD-10-CM | POA: Diagnosis not present

## 2023-09-18 DIAGNOSIS — R7301 Impaired fasting glucose: Secondary | ICD-10-CM | POA: Diagnosis not present

## 2023-09-18 DIAGNOSIS — N182 Chronic kidney disease, stage 2 (mild): Secondary | ICD-10-CM | POA: Diagnosis not present

## 2023-09-25 DIAGNOSIS — Z0000 Encounter for general adult medical examination without abnormal findings: Secondary | ICD-10-CM | POA: Diagnosis not present

## 2023-09-25 DIAGNOSIS — R7301 Impaired fasting glucose: Secondary | ICD-10-CM | POA: Diagnosis not present

## 2023-09-25 DIAGNOSIS — R5383 Other fatigue: Secondary | ICD-10-CM | POA: Diagnosis not present

## 2023-09-25 DIAGNOSIS — M150 Primary generalized (osteo)arthritis: Secondary | ICD-10-CM | POA: Diagnosis not present

## 2023-09-25 DIAGNOSIS — C439 Malignant melanoma of skin, unspecified: Secondary | ICD-10-CM | POA: Diagnosis not present

## 2023-09-25 DIAGNOSIS — I10 Essential (primary) hypertension: Secondary | ICD-10-CM | POA: Diagnosis not present

## 2023-09-25 DIAGNOSIS — E782 Mixed hyperlipidemia: Secondary | ICD-10-CM | POA: Diagnosis not present

## 2023-09-25 DIAGNOSIS — N182 Chronic kidney disease, stage 2 (mild): Secondary | ICD-10-CM | POA: Diagnosis not present

## 2024-02-18 DIAGNOSIS — Z87442 Personal history of urinary calculi: Secondary | ICD-10-CM | POA: Diagnosis not present

## 2024-02-18 DIAGNOSIS — Z8551 Personal history of malignant neoplasm of bladder: Secondary | ICD-10-CM | POA: Diagnosis not present
